# Patient Record
Sex: Male | Born: 1955 | Race: White | Hispanic: No | Marital: Married | Smoking: Never smoker
Health system: Southern US, Community
[De-identification: ages and names within clinical notes are randomized; demographics above are authoritative.]

## PROBLEM LIST (undated history)

## (undated) DIAGNOSIS — M1990 Unspecified osteoarthritis, unspecified site: Secondary | ICD-10-CM

## (undated) DIAGNOSIS — K219 Gastro-esophageal reflux disease without esophagitis: Secondary | ICD-10-CM

## (undated) DIAGNOSIS — F419 Anxiety disorder, unspecified: Secondary | ICD-10-CM

## (undated) DIAGNOSIS — I10 Essential (primary) hypertension: Secondary | ICD-10-CM

## (undated) HISTORY — PX: SEPTOPLASTY: SUR1290

## (undated) HISTORY — PX: TENDON REPAIR: SHX5111

---

## 1977-08-29 HISTORY — PX: APPENDECTOMY: SHX54

## 1984-08-29 HISTORY — PX: VASECTOMY: SHX75

## 1986-08-29 HISTORY — PX: EYE SURGERY: SHX253

## 2007-08-30 HISTORY — PX: BACK SURGERY: SHX140

## 2008-05-15 ENCOUNTER — Ambulatory Visit (HOSPITAL_COMMUNITY): Admission: RE | Admit: 2008-05-15 | Discharge: 2008-05-16 | Payer: Self-pay | Admitting: Neurosurgery

## 2009-03-19 IMAGING — CR DG CHEST 2V
2 series · 2 of 2 positions shown · non-contrast
Comparison: None

CLINICAL DATA: Lumbar disc herniation, hypertension, preoperative
assessment

CHEST - 2 VIEW

[view not recorded (1 of 2)]
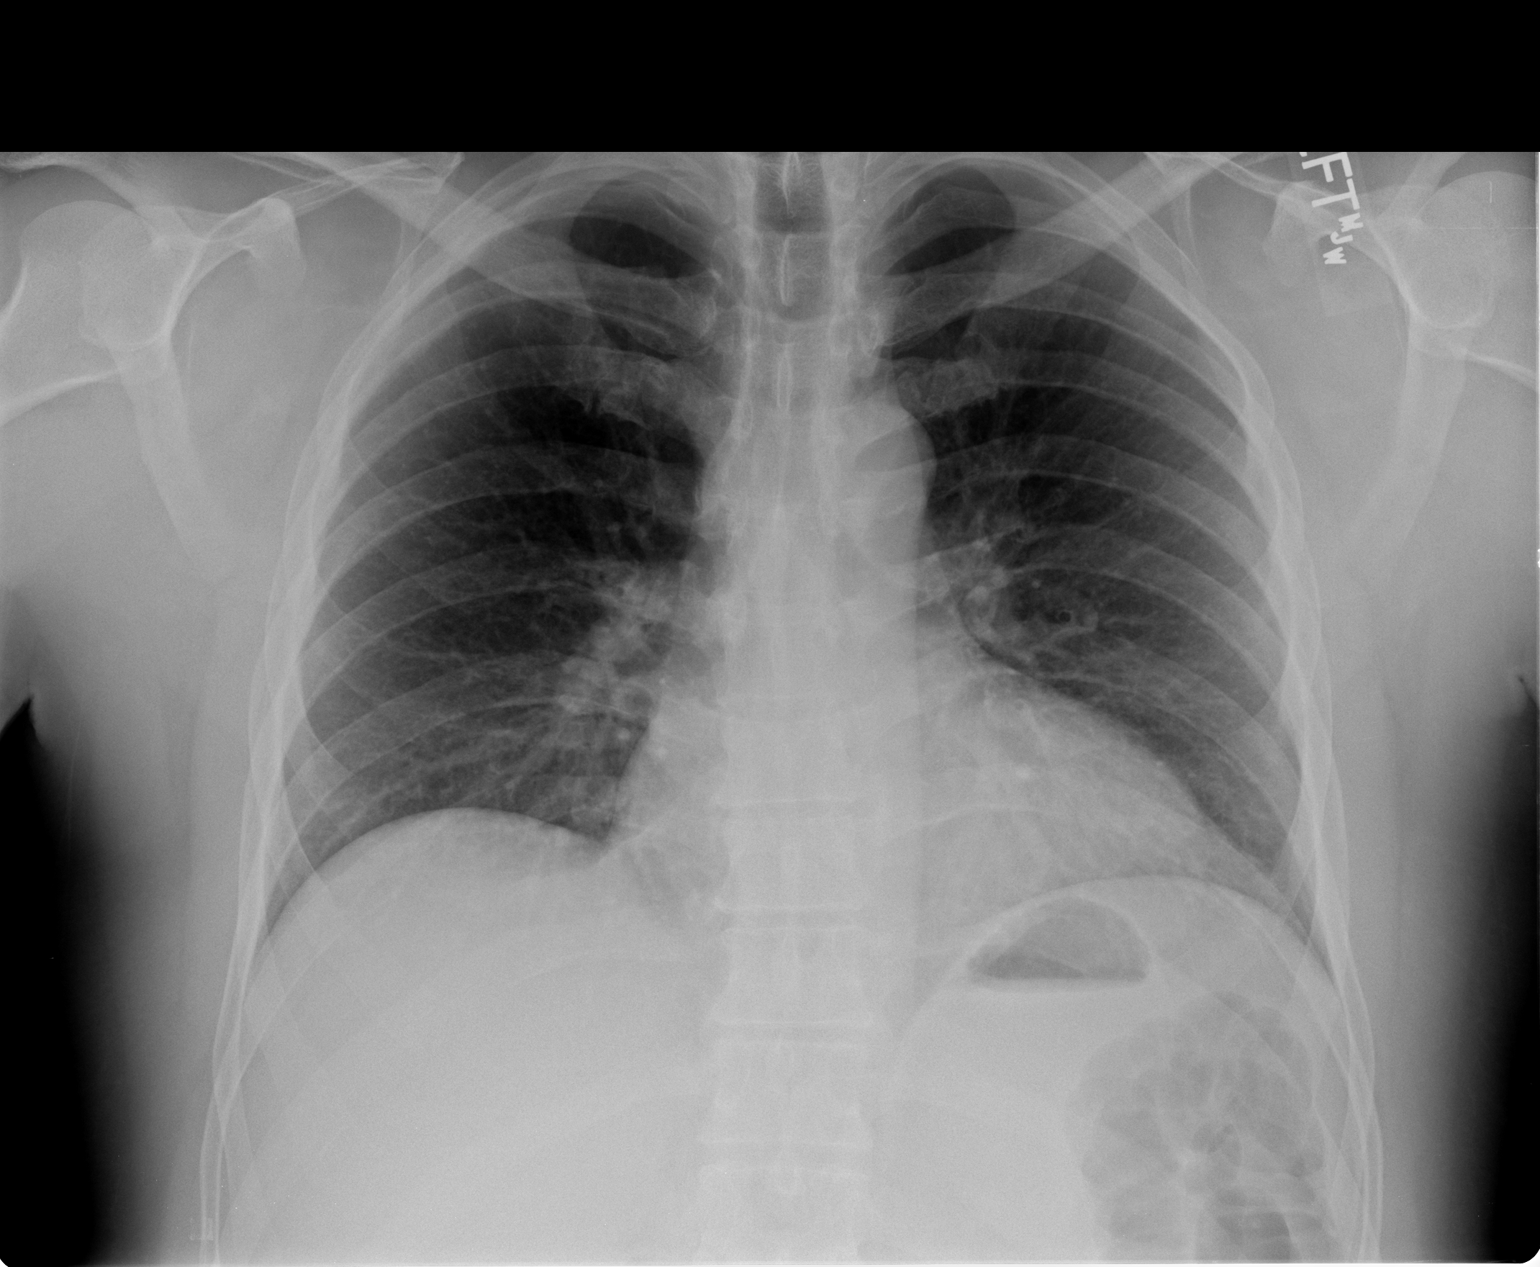

[view not recorded (2 of 2)]
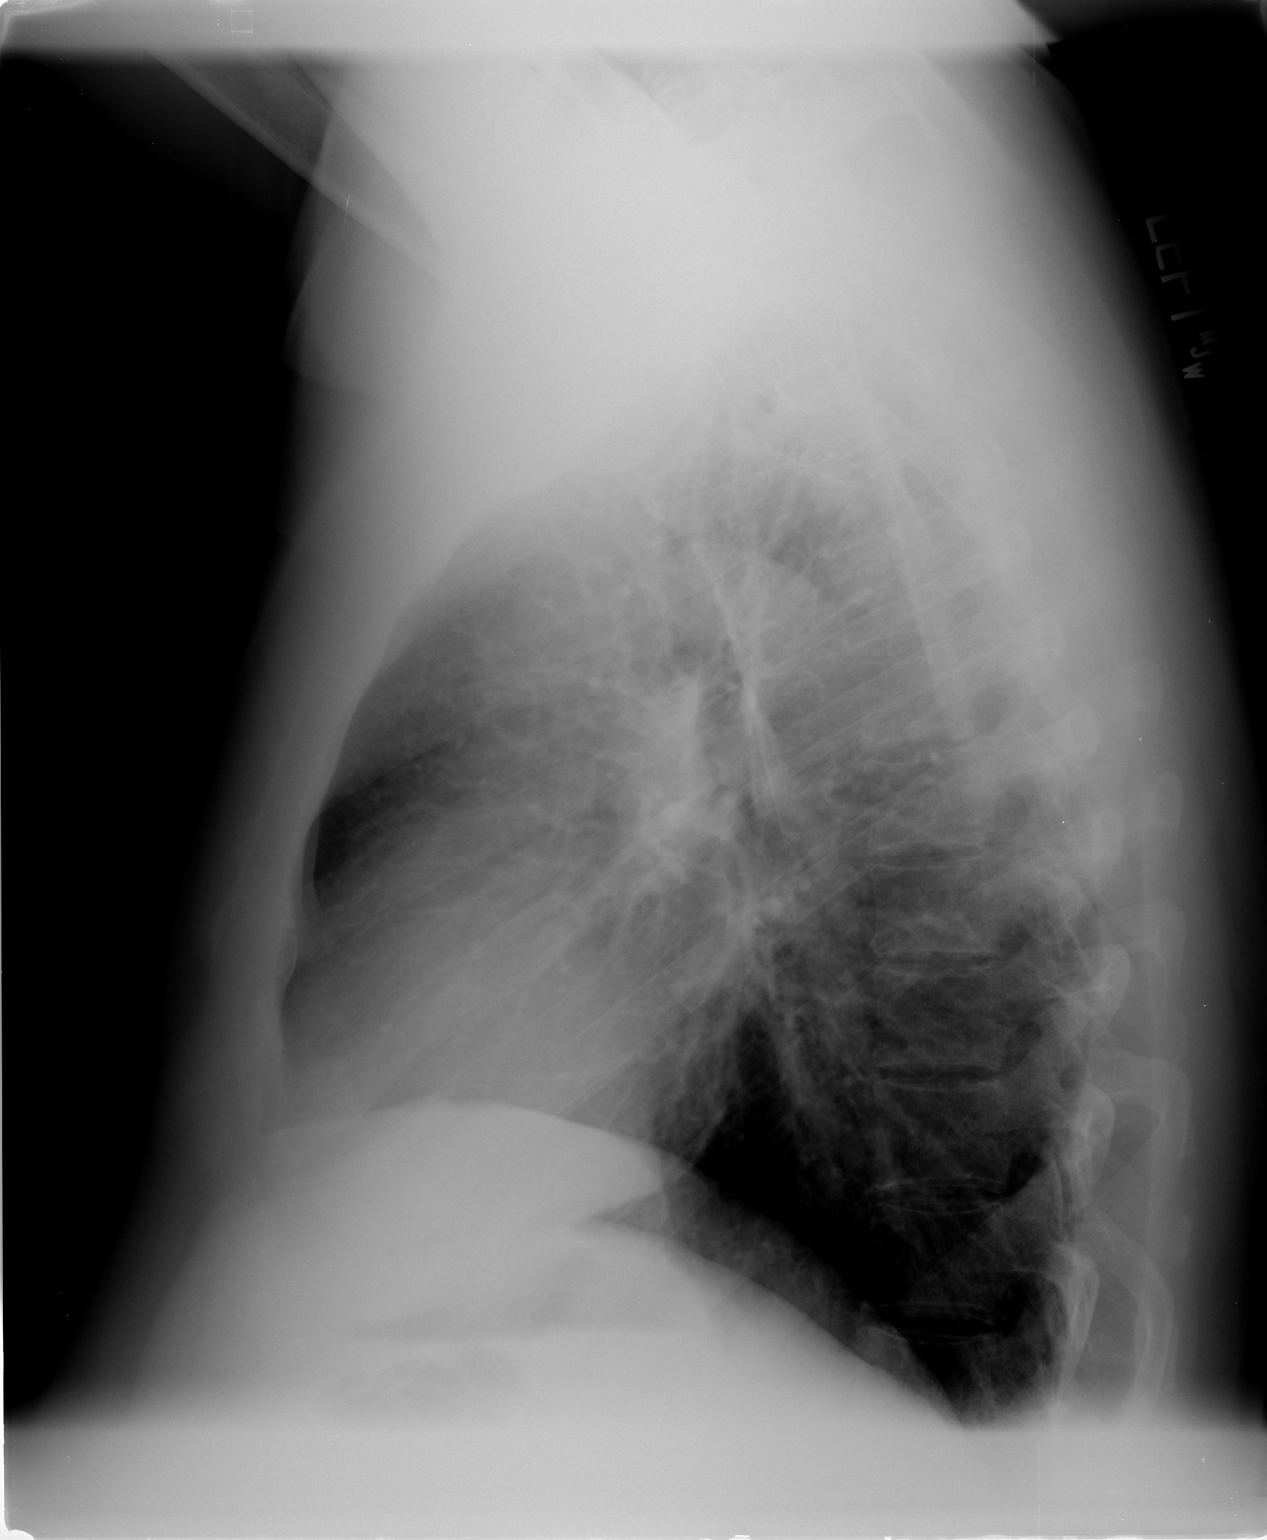

[2 of 2 positions shown; findings below may reference images not displayed]

FINDINGS: Cardiac enlargement.
Normal mediastinal contours and pulmonary vascularity.
Bronchitic changes without infiltrate or effusion.
No acute bony abnormalities.
IMPRESSION: Cardiomegaly with minimal bronchitic changes.

## 2011-01-11 NOTE — Op Note (Signed)
NAMEVITALI, Bryan Ellis            ACCOUNT NO.:  [id number]   MEDICAL RECORD NO.:  [id number]          PATIENT TYPE:  OIB   LOCATION:  3528                         FACILITY:  [facility]   PHYSICIAN:  Cristi Loron, M.D.DATE OF BIRTH:  [birth year]   DATE OF PROCEDURE:  05/15/2008  DATE OF DISCHARGE:                               OPERATIVE REPORT   BRIEF HISTORY:  The patient is a 55 year old white male who suffered  from back and right leg pain.  He failed medical management, was worked  up with lumbar MRI, which demonstrated he had  multiple bulging disks,  but had a ruptured disk behind the right L3 vertebral body.  I discussed  the various treatment options with the patient including the surgery.  He is aware of the risks, benefits, and alternatives of surgery, and  decided to proceed with L3 laminectomy to decompress the L3 and L4 nerve  roots.   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   PROCEDURES:  Right L3 hemilaminectomy to decompress the right L3 and L4  nerve roots using microdissection.   SURGEON:  Cristi Loron, MD   ASSISTANT:  Coletta Memos, MD   ANESTHESIA:  General endotracheal.   ESTIMATED BLOOD LOSS:  50 mL.   SPECIMENS:  None.   DRAINS:  None.   COMPLICATIONS:  None.   DESCRIPTION OF PROCEDURE:  The patient was brought to the operating room  by anesthesia team and general endotracheal anesthesia was induced.  The  patient was turned to prone position on the [place] frame.  His  lumbosacral region was then prepared with Betadine scrub and Betadine  solution.  Sterile drapes were applied.  I then injected the area to be  incised with Marcaine with epinephrine solution.  I used a scalpel to  make a linear midline incision over  the L3-4 interspace.  I used  electrocautery to perform a right-sided subperiosteal dissection  exposing the right spinous process and lamina of L3.  We obtained  intraoperative radiograph to confirm our location.   We then inserted the [facility] retractor for exposure.  We brought the  operative microscope into the field and under its magnification and  illumination, we completed the microdissection/decompression.  I used a  high-speed drill to perform a right L3 laminotomy.  I completed the  right L3 hemilaminectomy with Kerrison punch and removed the right L3-4  and L2-3 ligamentum flavum.  This gave [place] good exposure of the thecal  sac.  We then performed foraminotomies about the right L3 and L4 nerve  roots.  We then used microdissection to free up the thecal sac and nerve  roots from the epidural tissue.  The thecal sac and the nerve roots were  retracted medially with a nerve retractor.  This exposed a relatively  small disk herniation at L3-4 on the right, which was directly ventral  to the exiting L3 nerve root.  We removed in multiple fragments using  pituitary  forceps.  Because this disk herniation was smaller than I  anticipated, we obtained a second intraoperative radiograph that  demonstrated the extent of laminectomy and dural exposure.  We then  palpated along the ventral surface of the thecal sac along the exit  route of the right L3 and L4 nerve roots and noted that the neural  structures were well decompressed.  We obtained hemostasis using bipolar  electrocautery.  We irrigated the wound out with bacitracin solution,  removed the retractors, and then reapproximated the patient's  thoracolumbar fascia with interrupted #1 Vicryl suture, subcutaneous  tissue with interrupted 2-0 Vicryl suture, and the skin with Steri-  Strips and benzoin.  The wound was then coated with bacitracin ointment  and sterile dressing applied.  The drapes were removed.  The patient was   subsequently returned to the supine position where he was extubated by  the anesthesia team and transported to the Post Anesthesia Care Unit in  stable condition.  All sponge, instrument, and needle counts were  correct at this case.       Cristi Loron, M.D.  Electronically Signed     JDJ/MEDQ  D:  05/15/2008  T:  05/16/2008  Job:  [id number]

## 2011-06-01 LAB — NO BLOOD PRODUCTS

## 2011-06-01 LAB — BASIC METABOLIC PANEL
Calcium: 9.8
Creatinine, Ser: 1.03
GFR calc Af Amer: >60
Sodium: 138

## 2011-06-01 LAB — CBC
RBC: 5.01
WBC: 8.6

## 2014-04-01 ENCOUNTER — Ambulatory Visit (INDEPENDENT_AMBULATORY_CARE_PROVIDER_SITE_OTHER): Payer: 59 | Admitting: Podiatrist

## 2014-04-01 ENCOUNTER — Encounter: Payer: Self-pay | Admitting: Podiatrist

## 2014-04-01 ENCOUNTER — Ambulatory Visit (INDEPENDENT_AMBULATORY_CARE_PROVIDER_SITE_OTHER): Payer: 59

## 2014-04-01 VITALS — BP 145/87 | HR 57 | Resp 18

## 2014-04-01 DIAGNOSIS — M722 Plantar fascial fibromatosis: Secondary | ICD-10-CM

## 2014-04-01 MED ORDER — MELOXICAM 15 MG PO TABS: 15.0000 mg | ORAL_TABLET | Freq: Every day | ORAL | Status: AC

## 2014-04-01 MED ORDER — TRIAMCINOLONE ACETONIDE 10 MG/ML IJ SUSP
10.0000 mg | Freq: Once | INTRAMUSCULAR | Status: AC
  Administered 2014-04-01: 10 mg

## 2014-04-01 MED ORDER — MELOXICAM 15 MG PO TABS: 15.0000 mg | ORAL_TABLET | Freq: Every day | ORAL | Status: DC

## 2014-04-01 NOTE — Progress Notes (Signed)
   Subjective:    Patient ID: Bryan Ellis, male    DOB: 07/02/1956, 58 y.o.   MRN: [id number]  HPI I HAVE SOME RIGHT HEEL PAIN AND HAS BEEN GOING ON FOR ABOUT 2 MONTHS AND HURTS WITH WEIGHT AND FEELS LIKE A BRUISE AND SORE AND TENDER AND HURTS IN AM AND AFTER I GET UP    Review of Systems  Musculoskeletal: Positive for gait problem.  All other systems reviewed and are negative.      Objective:   Physical Exam Patient is awake, alert, and oriented x 3.  In no acute distress.  Vascular status is intact with palpable pedal pulses at 2/4 DP and PT bilateral and capillary refill time within normal limits. Neurological sensation is also intact bilaterally via Semmes Weinstein monofilament at 5/5 sites. Light touch, vibratory sensation, Achilles tendon reflex is intact. Dermatological exam reveals skin color, turger and texture as normal. No open lesions present.  Musculature intact with dorsiflexion, plantarflexion, inversion, eversion. High arched foot type with fat pad atrophy is noted. Pain on palpation plantar medial aspect of the right heel is present with palpable tenderness noted. Plantar fasciitis symptomatology elicited.    Assessment & Plan:  Plantar fasciitis right   Plan: An injection consisting of Kenalog and Marcaine mixture was infiltrated into the area of palpable tenderness of the right foot. The patient tolerated this procedure well. Removable plantar fascial bracing was applied and he was discussed the positive long-term benefits of a custom orthotic device due to his foot type and high arched foot. He will call if he would like to consider the device. Stretching exercises icing exercises and shoe gear changes were also discussed. Her prescription for meloxicam was also called in for him to take for 2 weeks and then when necessary

## 2014-04-01 NOTE — Patient Instructions (Signed)

## 2014-04-22 ENCOUNTER — Encounter: Payer: Self-pay | Admitting: Podiatrist

## 2014-04-22 ENCOUNTER — Ambulatory Visit (INDEPENDENT_AMBULATORY_CARE_PROVIDER_SITE_OTHER): Payer: 59 | Admitting: Podiatrist

## 2014-04-22 VITALS — BP 138/80 | HR 57 | Resp 18

## 2014-04-22 DIAGNOSIS — M722 Plantar fascial fibromatosis: Secondary | ICD-10-CM

## 2014-04-25 ENCOUNTER — Telehealth: Payer: Self-pay | Admitting: *Deleted

## 2014-04-25 NOTE — Telephone Encounter (Signed)
He was wanting me to call y'all and see if his insurance had approved the orthopedic inserts.  So if someone could give me a call back.  He's just a little bit anxious because his foot is still hurting him so bad.  Thank you very much.

## 2014-04-25 NOTE — Progress Notes (Signed)
Chief Complaint  Patient presents with  . Plantar Fasciitis    MY RIGHT HEEL WAS DOING GOOD AND THEN IS IS STILL SORE AND NOT AS BAD AS IT WAS BUT I CAN STILL FEEL IT     HPI: Patient is 58 y.o. male who presents today for followup plantar fasciitis right heel  Physical Exam  Patient is awake, alert, and oriented x 3.  In no acute distress.  Vascular status is intact with palpable pedal pulses at 2/4 DP and PT bilateral and capillary refill time within normal limits. Neurological sensation is also intact bilaterally via Semmes Weinstein monofilament at 5/5 sites. Light touch, vibratory sensation, Achilles tendon reflex is intact. Dermatological exam reveals skin color, turger and texture as normal. No open lesions present.  Musculature intact with dorsiflexion, plantarflexion, inversion, eversion. Continued plantar fasciitis symptomatology is elicited right heel   Assessment: Plantar fasciitis right heel  Plan: And then injected the right heel for injection #2. Discussed the positive long-term benefits orthotics, shoe gear changes, stretching, icing and conservative therapies were discussed. He'll be seen back if he continues to be problematic.

## 2014-04-28 NOTE — Telephone Encounter (Signed)
This is for melody. lisa

## 2014-07-29 NOTE — Telephone Encounter (Signed)
This encounter was created in error - please disregard.

## 2017-04-25 ENCOUNTER — Other Ambulatory Visit: Payer: Self-pay | Admitting: Neurosurgery

## 2017-06-12 NOTE — Pre-Procedure Instructions (Signed)
GASPARD ISBELL  06/12/2017      [facility] - [place], [place] [address] [place] [address] Phone: [phone] Fax: [phone]    Your procedure is scheduled on Mon. Oct. 22  Report to [facility] Admitting at 5:30  A.M.  Call this number if you have problems the morning of surgery:  [phone]   Remember:  Do not eat food or drink liquids after midnight on Sun. Oct. 21   Take these medicines the morning of surgery with A SIP OF WATER :amlodipine (norvasc),buspirine (buspar), carvedilol (coreg), pantoprazole (protonix)             7 days prior to surgery STOP taking any Aspirin (unless otherwise instructed by your surgeon), Aleve, Naproxen, Ibuprofen, Motrin, Advil, Goody's, BC's, all herbal medications, fish oil, and all vitamins   Do not wear jewelry.  Do not wear lotions, powders, or perfumes, or deoderant.  Do not shave 48 hours prior to surgery.  Men may shave face and neck.  Do not bring valuables to the hospital.  [facility] is not responsible for any belongings or valuables.  Contacts, dentures or bridgework may not be worn into surgery.  Leave your suitcase in the car.  After surgery it may be brought to your room.  For patients admitted to the hospital, discharge time will be determined by your treatment team.  Patients discharged the day of surgery will not be allowed to drive home.   Special instructions:  [place]- Preparing For Surgery  Before surgery, you can play an important role. Because skin is not sterile, your skin needs to be as free of germs as possible. You can reduce the number of germs on your skin by washing with CHG (chlorahexidine gluconate) Soap before surgery.  CHG is an antiseptic cleaner which kills germs and bonds with the skin to continue killing germs even after washing.  Please do not use if you have an allergy to CHG or antibacterial soaps. If your skin becomes reddened/irritated  stop using the CHG.  Do not shave (including legs and underarms) for at least 48 hours prior to first CHG shower. It is OK to shave your face.  Please follow these instructions carefully.   1. Shower the NIGHT BEFORE SURGERY and the MORNING OF SURGERY with CHG.   2. If you chose to wash your hair, wash your hair first as usual with your normal shampoo.  3. After you shampoo, rinse your hair and body thoroughly to remove the shampoo.  4. Use CHG as you would any other liquid soap. You can apply CHG directly to the skin and wash gently with a scrungie or a clean washcloth.   5. Apply the CHG Soap to your body ONLY FROM THE NECK DOWN.  Do not use on open wounds or open sores. Avoid contact with your eyes, ears, mouth and genitals (private parts). Wash Face and genitals (private parts)  with your normal soap.  6. Wash thoroughly, paying special attention to the area where your surgery will be performed.  7. Thoroughly rinse your body with warm water from the neck down.  8. DO NOT shower/wash with your normal soap after using and rinsing off the CHG Soap.  9. Pat yourself dry with a CLEAN TOWEL.  10. Wear CLEAN PAJAMAS to bed the night before surgery, wear comfortable clothes the morning of surgery  11. Place CLEAN SHEETS on your bed the night of your first  shower and DO NOT SLEEP WITH PETS.    Day of Surgery: Do not apply any deodorants/lotions. Please wear clean clothes to the hospital/surgery center.      Please read over the following fact sheets that you were given. Coughing and Deep Breathing, MRSA Information and Surgical Site Infection Prevention

## 2017-06-13 ENCOUNTER — Encounter (HOSPITAL_COMMUNITY): Payer: Self-pay

## 2017-06-13 ENCOUNTER — Encounter (HOSPITAL_COMMUNITY)
Admission: RE | Admit: 2017-06-13 | Discharge: 2017-06-13 | Disposition: A | Discharge status: Home / Self Care | Payer: 59 | Source: Ambulatory Visit | Attending: Neurosurgery | Admitting: Neurosurgery

## 2017-06-13 DIAGNOSIS — Z01812 Encounter for preprocedural laboratory examination: Secondary | ICD-10-CM | POA: Insufficient documentation

## 2017-06-13 DIAGNOSIS — Z01810 Encounter for preprocedural cardiovascular examination: Secondary | ICD-10-CM | POA: Insufficient documentation

## 2017-06-13 DIAGNOSIS — M5416 Radiculopathy, lumbar region: Secondary | ICD-10-CM | POA: Insufficient documentation

## 2017-06-13 DIAGNOSIS — R001 Bradycardia, unspecified: Secondary | ICD-10-CM | POA: Insufficient documentation

## 2017-06-13 HISTORY — DX: Essential (primary) hypertension: I10

## 2017-06-13 HISTORY — DX: Gastro-esophageal reflux disease without esophagitis: K21.9

## 2017-06-13 HISTORY — DX: Anxiety disorder, unspecified: F41.9

## 2017-06-13 HISTORY — DX: Unspecified osteoarthritis, unspecified site: M19.90

## 2017-06-13 LAB — BASIC METABOLIC PANEL
Anion gap: 8 (ref 5–15)
BUN: 12 mg/dL (ref 6–20)
CHLORIDE: 105 mmol/L (ref 101–111)
CO2: 25 mmol/L (ref 22–32)
Calcium: 9.7 mg/dL (ref 8.9–10.3)
Creatinine, Ser: 0.9 mg/dL (ref 0.61–1.24)
GFR calc non Af Amer: >60 mL/min (ref >60)
Glucose, Bld: 109 mg/dL — ABNORMAL HIGH (ref 65–99)
POTASSIUM: 3.4 mmol/L — AB (ref 3.5–5.1)
SODIUM: 138 mmol/L (ref 135–145)

## 2017-06-13 LAB — CBC
HEMATOCRIT: 44.9 % (ref 39.0–52.0)
Hemoglobin: 15.8 g/dL (ref 13.0–17.0)
MCH: 31.5 pg (ref 26.0–34.0)
MCHC: 35.2 g/dL (ref 30.0–36.0)
MCV: 89.4 fL (ref 78.0–100.0)
Platelets: 208 10*3/uL (ref 150–400)
RBC: 5.02 MIL/uL (ref 4.22–5.81)
RDW: 13.5 % (ref 11.5–15.5)
WBC: 7 10*3/uL (ref 4.0–10.5)

## 2017-06-13 LAB — NO BLOOD PRODUCTS

## 2017-06-13 LAB — SURGICAL PCR SCREEN
MRSA, PCR: NEGATIVE
STAPHYLOCOCCUS AUREUS: NEGATIVE

## 2017-06-13 NOTE — Progress Notes (Signed)
PCP: Dr. Kennith Maes @ [facility] in [place], [place]  Cardiologist: Dr. Esmeralda Links only once  Pt. Report he has a flushing sensation once every 6 weeks that last 24 hours. PCP is aware. States this has been going on for 4 yrs. And they don't know what is causing it. Pt. Reports Dr. Helene Kelp thinks it's stress related. It starts when he is sleeping, and starts at the chest and goes to his head. Denis chest pain or palpitations

## 2017-06-14 NOTE — Progress Notes (Signed)
Anesthesia Chart Review:  Pt refuses all blood products.   Pt is a 61 year old male scheduled for L2-3, L3-4, L4-5 microdiscectomy, laminectomy on 06/19/2017 with Newman Pies, MD  - PCP is Kennith Maes, MD - Pt saw cardiologist Shirlee More, MD 06/23/15 for "flushing" sensation pt experiences every 6 weeks or so for 24 hours for last several years. Felt to be non-cardiac, no work up pursued.   PMH includes:  HTN, GERD. Never smoker. BMI 27  Medications include: Amlodipine, ASA 81 mg, carvedilol, Protonix, valsartan-HCTZ  BP (!) 159/81   Pulse (!) 58   Temp 36.8 C   Resp 20   Ht 5\' 11"  (1.803 m)   Wt 191 lb 3.2 oz (86.7 kg)   SpO2 99%   BMI 26.67 kg/m   Preoperative labs reviewed.    EKG 06/13/17: Sinus bradycardia (57 bpm)  If no changes, I anticipate pt can proceed with surgery as scheduled.   Willeen Cass, FNP-BC [facility] Short Stay Surgical Center/Anesthesiology Phone: [phone] 06/14/2017 4:19 PM

## 2017-06-18 NOTE — Anesthesia Preprocedure Evaluation (Addendum)
Anesthesia Evaluation  Patient identified by MRN, date of birth, ID band Patient awake    Reviewed: Allergy & Precautions, NPO status , Patient's Chart, lab work & pertinent test results, reviewed documented beta blocker date and time   Airway Mallampati: III  TM Distance: >3 FB Neck ROM: Full    Dental no notable dental hx. (+) Teeth Intact, Dental Advisory Given   Pulmonary neg pulmonary ROS,    Pulmonary exam normal breath sounds clear to auscultation       Cardiovascular hypertension, Pt. on medications and Pt. on home beta blockers negative cardio ROS Normal cardiovascular exam Rhythm:Regular Rate:Normal     Neuro/Psych negative neurological ROS  negative psych ROS   GI/Hepatic negative GI ROS, Neg liver ROS, GERD  Medicated and Controlled,  Endo/Other  negative endocrine ROS  Renal/GU negative Renal ROS  negative genitourinary   Musculoskeletal negative musculoskeletal ROS (+)   Abdominal   Peds negative pediatric ROS (+)  Hematology negative hematology ROS (+)   Anesthesia Other Findings   Reproductive/Obstetrics negative OB ROS                            Anesthesia Physical Anesthesia Plan  ASA: II  Anesthesia Plan: General   Post-op Pain Management:    Induction: Intravenous  PONV Risk Score and Plan: 2 and Ondansetron, Dexamethasone, Treatment may vary due to age or medical condition and Midazolam  Airway Management Planned: Oral ETT  Additional Equipment:   Intra-op Plan:   Post-operative Plan: Extubation in OR  Informed Consent:   Plan Discussed with:   Anesthesia Plan Comments: (  )        Anesthesia Quick Evaluation

## 2017-06-19 ENCOUNTER — Encounter (HOSPITAL_COMMUNITY): Payer: Self-pay | Admitting: General Practice

## 2017-06-19 ENCOUNTER — Ambulatory Visit (HOSPITAL_COMMUNITY): Payer: 59 | Admitting: Vascular Surgery

## 2017-06-19 ENCOUNTER — Ambulatory Visit (HOSPITAL_COMMUNITY): Payer: 59 | Admitting: Anesthesiology

## 2017-06-19 ENCOUNTER — Ambulatory Visit (HOSPITAL_COMMUNITY)
Admission: RE | Admit: 2017-06-19 | Discharge: 2017-06-19 | Disposition: A | Discharge status: Home / Self Care | Payer: 59 | Source: Ambulatory Visit | Attending: Neurosurgery | Admitting: Neurosurgery

## 2017-06-19 ENCOUNTER — Encounter (HOSPITAL_COMMUNITY)
Admission: RE | Disposition: A | Discharge status: Home / Self Care | Payer: Self-pay | Source: Ambulatory Visit | Attending: Neurosurgery

## 2017-06-19 DIAGNOSIS — I10 Essential (primary) hypertension: Secondary | ICD-10-CM | POA: Insufficient documentation

## 2017-06-19 DIAGNOSIS — F419 Anxiety disorder, unspecified: Secondary | ICD-10-CM | POA: Diagnosis not present

## 2017-06-19 DIAGNOSIS — Z419 Encounter for procedure for purposes other than remedying health state, unspecified: Secondary | ICD-10-CM

## 2017-06-19 DIAGNOSIS — K219 Gastro-esophageal reflux disease without esophagitis: Secondary | ICD-10-CM | POA: Insufficient documentation

## 2017-06-19 DIAGNOSIS — Z79899 Other long term (current) drug therapy: Secondary | ICD-10-CM | POA: Insufficient documentation

## 2017-06-19 DIAGNOSIS — M48061 Spinal stenosis, lumbar region without neurogenic claudication: Secondary | ICD-10-CM | POA: Insufficient documentation

## 2017-06-19 DIAGNOSIS — M5116 Intervertebral disc disorders with radiculopathy, lumbar region: Secondary | ICD-10-CM | POA: Insufficient documentation

## 2017-06-19 DIAGNOSIS — Z7982 Long term (current) use of aspirin: Secondary | ICD-10-CM | POA: Diagnosis not present

## 2017-06-19 DIAGNOSIS — Z791 Long term (current) use of non-steroidal anti-inflammatories (NSAID): Secondary | ICD-10-CM | POA: Insufficient documentation

## 2017-06-19 DIAGNOSIS — M5416 Radiculopathy, lumbar region: Secondary | ICD-10-CM

## 2017-06-19 HISTORY — PX: LUMBAR LAMINECTOMY/DECOMPRESSION MICRODISCECTOMY: SHX5026

## 2017-06-19 SURGERY — LUMBAR LAMINECTOMY/DECOMPRESSION MICRODISCECTOMY 2 LEVELS
Anesthesia: General | Site: Back | Laterality: Left

## 2017-06-19 MED ORDER — ROCURONIUM BROMIDE 100 MG/10ML IV SOLN
INTRAVENOUS | Status: DC | PRN
  Administered 2017-06-19: 50 mg via INTRAVENOUS

## 2017-06-19 MED ORDER — PROPOFOL 10 MG/ML IV BOLUS
INTRAVENOUS | Status: AC
  Filled 2017-06-19: qty 20

## 2017-06-19 MED ORDER — 0.9 % SODIUM CHLORIDE (POUR BTL) OPTIME
TOPICAL | Status: DC | PRN
  Administered 2017-06-19: 1000 mL

## 2017-06-19 MED ORDER — DEXAMETHASONE SODIUM PHOSPHATE 10 MG/ML IJ SOLN
INTRAMUSCULAR | Status: DC | PRN
  Administered 2017-06-19: 10 mg via INTRAVENOUS

## 2017-06-19 MED ORDER — BUPIVACAINE-EPINEPHRINE (PF) 0.5% -1:200000 IJ SOLN
INTRAMUSCULAR | Status: AC
  Filled 2017-06-19: qty 30

## 2017-06-19 MED ORDER — HYDROCHLOROTHIAZIDE 25 MG PO TABS
25.0000 mg | ORAL_TABLET | Freq: Every day | ORAL | Status: DC
  Administered 2017-06-19: 25 mg via ORAL
  Filled 2017-06-19: qty 1

## 2017-06-19 MED ORDER — ONDANSETRON HCL 4 MG/2ML IJ SOLN
INTRAMUSCULAR | Status: DC | PRN
  Administered 2017-06-19: 4 mg via INTRAVENOUS

## 2017-06-19 MED ORDER — FENTANYL CITRATE (PF) 100 MCG/2ML IJ SOLN: 25.0000 ug | INTRAMUSCULAR | Status: DC | PRN

## 2017-06-19 MED ORDER — EPHEDRINE 5 MG/ML INJ
INTRAVENOUS | Status: AC
  Filled 2017-06-19: qty 10

## 2017-06-19 MED ORDER — MENTHOL 3 MG MT LOZG: 1.0000 | LOZENGE | OROMUCOSAL | Status: DC | PRN

## 2017-06-19 MED ORDER — CARVEDILOL 6.25 MG PO TABS
18.7500 mg | ORAL_TABLET | Freq: Two times a day (BID) | ORAL | Status: DC
  Filled 2017-06-19: qty 3

## 2017-06-19 MED ORDER — CEFAZOLIN SODIUM-DEXTROSE 2-4 GM/100ML-% IV SOLN
2.0000 g | INTRAVENOUS | Status: AC
  Administered 2017-06-19: 2 g via INTRAVENOUS

## 2017-06-19 MED ORDER — DEXAMETHASONE SODIUM PHOSPHATE 10 MG/ML IJ SOLN
INTRAMUSCULAR | Status: AC
  Filled 2017-06-19: qty 1

## 2017-06-19 MED ORDER — SUGAMMADEX SODIUM 200 MG/2ML IV SOLN
INTRAVENOUS | Status: AC
  Filled 2017-06-19: qty 2

## 2017-06-19 MED ORDER — IRBESARTAN 300 MG PO TABS
300.0000 mg | ORAL_TABLET | Freq: Every day | ORAL | Status: DC
  Filled 2017-06-19: qty 1

## 2017-06-19 MED ORDER — SODIUM CHLORIDE 0.9% FLUSH: 3.0000 mL | Freq: Two times a day (BID) | INTRAVENOUS | Status: DC

## 2017-06-19 MED ORDER — SODIUM CHLORIDE 0.9 % IV SOLN: 250.0000 mL | INTRAVENOUS | Status: DC

## 2017-06-19 MED ORDER — BISACODYL 10 MG RE SUPP
10.0000 mg | Freq: Every day | RECTAL | Status: DC | PRN
Start: 2017-06-19 — End: 2017-06-19

## 2017-06-19 MED ORDER — CEFAZOLIN SODIUM-DEXTROSE 2-4 GM/100ML-% IV SOLN
INTRAVENOUS | Status: AC
  Filled 2017-06-19: qty 100

## 2017-06-19 MED ORDER — MIDAZOLAM HCL 5 MG/5ML IJ SOLN
INTRAMUSCULAR | Status: DC | PRN
  Administered 2017-06-19: 2 mg via INTRAVENOUS

## 2017-06-19 MED ORDER — VALSARTAN-HYDROCHLOROTHIAZIDE 320-25 MG PO TABS: 1.0000 | ORAL_TABLET | Freq: Every day | ORAL | Status: DC

## 2017-06-19 MED ORDER — BUSPIRONE HCL 10 MG PO TABS
10.0000 mg | ORAL_TABLET | Freq: Two times a day (BID) | ORAL | Status: DC
Start: 2017-06-19 — End: 2017-06-19
  Filled 2017-06-19 (×2): qty 1

## 2017-06-19 MED ORDER — THROMBIN (RECOMBINANT) 5000 UNITS EX SOLR
CUTANEOUS | Status: DC | PRN
  Administered 2017-06-19 (×2): 5000 [IU] via TOPICAL

## 2017-06-19 MED ORDER — ONDANSETRON HCL 4 MG PO TABS: 4.0000 mg | ORAL_TABLET | Freq: Four times a day (QID) | ORAL | Status: DC | PRN

## 2017-06-19 MED ORDER — MIDAZOLAM HCL 2 MG/2ML IJ SOLN
INTRAMUSCULAR | Status: AC
  Filled 2017-06-19: qty 2

## 2017-06-19 MED ORDER — HEMOSTATIC AGENTS (NO CHARGE) OPTIME
TOPICAL | Status: DC | PRN
  Administered 2017-06-19: 1 via TOPICAL

## 2017-06-19 MED ORDER — FENTANYL CITRATE (PF) 250 MCG/5ML IJ SOLN
INTRAMUSCULAR | Status: AC
  Filled 2017-06-19: qty 5

## 2017-06-19 MED ORDER — BACITRACIN ZINC 500 UNIT/GM EX OINT
TOPICAL_OINTMENT | CUTANEOUS | Status: DC | PRN
  Administered 2017-06-19: 1 via TOPICAL

## 2017-06-19 MED ORDER — MEPERIDINE HCL 25 MG/ML IJ SOLN: 6.2500 mg | INTRAMUSCULAR | Status: DC | PRN

## 2017-06-19 MED ORDER — SUGAMMADEX SODIUM 200 MG/2ML IV SOLN
INTRAVENOUS | Status: DC | PRN
  Administered 2017-06-19: 200 mg via INTRAVENOUS

## 2017-06-19 MED ORDER — EPHEDRINE SULFATE 50 MG/ML IJ SOLN
INTRAMUSCULAR | Status: DC | PRN
  Administered 2017-06-19 (×2): 5 mg via INTRAVENOUS

## 2017-06-19 MED ORDER — OXYCODONE HCL 5 MG PO TABS: 5.0000 mg | ORAL_TABLET | ORAL | Status: DC | PRN

## 2017-06-19 MED ORDER — ONDANSETRON HCL 4 MG/2ML IJ SOLN
INTRAMUSCULAR | Status: AC
  Filled 2017-06-19: qty 2

## 2017-06-19 MED ORDER — ONDANSETRON HCL 4 MG/2ML IJ SOLN: 4.0000 mg | Freq: Once | INTRAMUSCULAR | Status: DC | PRN

## 2017-06-19 MED ORDER — ACETAMINOPHEN 325 MG PO TABS: 650.0000 mg | ORAL_TABLET | ORAL | Status: DC | PRN

## 2017-06-19 MED ORDER — ONDANSETRON HCL 4 MG/2ML IJ SOLN: 4.0000 mg | Freq: Four times a day (QID) | INTRAMUSCULAR | Status: DC | PRN

## 2017-06-19 MED ORDER — MELOXICAM 7.5 MG PO TABS
15.0000 mg | ORAL_TABLET | Freq: Every day | ORAL | Status: DC
  Filled 2017-06-19: qty 2

## 2017-06-19 MED ORDER — SODIUM CHLORIDE 0.9% FLUSH: 3.0000 mL | INTRAVENOUS | Status: DC | PRN

## 2017-06-19 MED ORDER — LACTATED RINGERS IV SOLN
INTRAVENOUS | Status: DC | PRN
  Administered 2017-06-19 (×2): via INTRAVENOUS

## 2017-06-19 MED ORDER — CHLORHEXIDINE GLUCONATE CLOTH 2 % EX PADS: 6.0000 | MEDICATED_PAD | Freq: Once | CUTANEOUS | Status: DC

## 2017-06-19 MED ORDER — PANTOPRAZOLE SODIUM 40 MG PO TBEC: 40.0000 mg | DELAYED_RELEASE_TABLET | Freq: Every day | ORAL | Status: DC

## 2017-06-19 MED ORDER — THROMBIN (RECOMBINANT) 5000 UNITS EX SOLR
OROMUCOSAL | Status: DC | PRN
  Administered 2017-06-19: 09:00:00 via TOPICAL

## 2017-06-19 MED ORDER — AMLODIPINE BESYLATE 2.5 MG PO TABS
2.5000 mg | ORAL_TABLET | Freq: Every day | ORAL | Status: DC
  Filled 2017-06-19: qty 1

## 2017-06-19 MED ORDER — OXYCODONE HCL 5 MG PO TABS: 5.0000 mg | ORAL_TABLET | ORAL | 0 refills | Status: AC | PRN

## 2017-06-19 MED ORDER — LIDOCAINE HCL (CARDIAC) 20 MG/ML IV SOLN
INTRAVENOUS | Status: DC | PRN
  Administered 2017-06-19: 100 mg via INTRAVENOUS

## 2017-06-19 MED ORDER — CEFAZOLIN SODIUM-DEXTROSE 2-4 GM/100ML-% IV SOLN
2.0000 g | Freq: Three times a day (TID) | INTRAVENOUS | Status: DC
  Administered 2017-06-19: 2 g via INTRAVENOUS
  Filled 2017-06-19: qty 100

## 2017-06-19 MED ORDER — ACETAMINOPHEN 650 MG RE SUPP: 650.0000 mg | RECTAL | Status: DC | PRN

## 2017-06-19 MED ORDER — MORPHINE SULFATE (PF) 4 MG/ML IV SOLN: 4.0000 mg | INTRAVENOUS | Status: DC | PRN

## 2017-06-19 MED ORDER — DOCUSATE SODIUM 100 MG PO CAPS
100.0000 mg | ORAL_CAPSULE | Freq: Two times a day (BID) | ORAL | Status: DC
  Administered 2017-06-19: 100 mg via ORAL
  Filled 2017-06-19: qty 1

## 2017-06-19 MED ORDER — DOCUSATE SODIUM 100 MG PO CAPS: 100.0000 mg | ORAL_CAPSULE | Freq: Two times a day (BID) | ORAL | 0 refills | Status: AC

## 2017-06-19 MED ORDER — SODIUM CHLORIDE 0.9 % IR SOLN
Status: DC | PRN
  Administered 2017-06-19: 09:00:00

## 2017-06-19 MED ORDER — FENTANYL CITRATE (PF) 100 MCG/2ML IJ SOLN
INTRAMUSCULAR | Status: DC | PRN
  Administered 2017-06-19: 50 ug via INTRAVENOUS
  Administered 2017-06-19: 100 ug via INTRAVENOUS

## 2017-06-19 MED ORDER — BACITRACIN ZINC 500 UNIT/GM EX OINT
TOPICAL_OINTMENT | CUTANEOUS | Status: AC
  Filled 2017-06-19: qty 28.35

## 2017-06-19 MED ORDER — CYCLOBENZAPRINE HCL 10 MG PO TABS: 10.0000 mg | ORAL_TABLET | Freq: Three times a day (TID) | ORAL | 1 refills | Status: AC | PRN

## 2017-06-19 MED ORDER — PROPOFOL 10 MG/ML IV BOLUS
INTRAVENOUS | Status: DC | PRN
  Administered 2017-06-19: 170 mg via INTRAVENOUS

## 2017-06-19 MED ORDER — BUPIVACAINE-EPINEPHRINE (PF) 0.5% -1:200000 IJ SOLN
INTRAMUSCULAR | Status: DC | PRN
  Administered 2017-06-19 (×2): 10 mL

## 2017-06-19 MED ORDER — LIDOCAINE 2% (20 MG/ML) 5 ML SYRINGE
INTRAMUSCULAR | Status: AC
  Filled 2017-06-19: qty 5

## 2017-06-19 MED ORDER — PHENOL 1.4 % MT LIQD: 1.0000 | OROMUCOSAL | Status: DC | PRN

## 2017-06-19 MED ORDER — CYCLOBENZAPRINE HCL 10 MG PO TABS: 10.0000 mg | ORAL_TABLET | Freq: Three times a day (TID) | ORAL | Status: DC | PRN

## 2017-06-19 MED ORDER — OXYCODONE HCL 5 MG PO TABS: 10.0000 mg | ORAL_TABLET | ORAL | Status: DC | PRN

## 2017-06-19 MED ORDER — ROCURONIUM BROMIDE 10 MG/ML (PF) SYRINGE
PREFILLED_SYRINGE | INTRAVENOUS | Status: AC
  Filled 2017-06-19: qty 5

## 2017-06-19 SURGICAL SUPPLY — 55 items
BAG DECANTER FOR FLEXI CONT (MISCELLANEOUS) ×2 IMPLANT
BENZOIN TINCTURE PRP APPL 2/3 (GAUZE/BANDAGES/DRESSINGS) ×2 IMPLANT
BLADE CLIPPER SURG (BLADE) ×2 IMPLANT
BUR MATCHSTICK NEURO 3.0 LAGG (BURR) ×2 IMPLANT
BUR PRECISION FLUTE 6.0 (BURR) ×2 IMPLANT
CANISTER SUCT 3000ML PPV (MISCELLANEOUS) ×2 IMPLANT
CARTRIDGE OIL MAESTRO DRILL (MISCELLANEOUS) ×1 IMPLANT
DIFFUSER DRILL AIR PNEUMATIC (MISCELLANEOUS) ×2 IMPLANT
DRAPE LAPAROTOMY 100X72X124 (DRAPES) ×2 IMPLANT
DRAPE MICROSCOPE LEICA (MISCELLANEOUS) ×2 IMPLANT
DRAPE POUCH INSTRU U-SHP 10X18 (DRAPES) ×2 IMPLANT
DRAPE SURG 17X23 STRL (DRAPES) ×8 IMPLANT
ELECT BLADE 4.0 EZ CLEAN MEGAD (MISCELLANEOUS) ×2
ELECT REM PT RETURN 9FT ADLT (ELECTROSURGICAL) ×2
ELECTRODE BLDE 4.0 EZ CLN MEGD (MISCELLANEOUS) ×1 IMPLANT
ELECTRODE REM PT RTRN 9FT ADLT (ELECTROSURGICAL) ×1 IMPLANT
GAUZE SPONGE 4X4 12PLY STRL (GAUZE/BANDAGES/DRESSINGS) ×2 IMPLANT
GAUZE SPONGE 4X4 16PLY XRAY LF (GAUZE/BANDAGES/DRESSINGS) IMPLANT
GLOVE BIO SURGEON STRL SZ8 (GLOVE) ×2 IMPLANT
GLOVE BIO SURGEON STRL SZ8.5 (GLOVE) ×2 IMPLANT
GLOVE BIOGEL PI IND STRL 6.5 (GLOVE) ×1 IMPLANT
GLOVE BIOGEL PI IND STRL 7.5 (GLOVE) ×1 IMPLANT
GLOVE BIOGEL PI IND STRL 8.5 (GLOVE) ×1 IMPLANT
GLOVE BIOGEL PI INDICATOR 6.5 (GLOVE) ×1
GLOVE BIOGEL PI INDICATOR 7.5 (GLOVE) ×1
GLOVE BIOGEL PI INDICATOR 8.5 (GLOVE) ×1
GLOVE ECLIPSE 9.0 STRL (GLOVE) ×2 IMPLANT
GLOVE EXAM NITRILE LRG STRL (GLOVE) IMPLANT
GLOVE EXAM NITRILE XL STR (GLOVE) IMPLANT
GLOVE EXAM NITRILE XS STR PU (GLOVE) IMPLANT
GLOVE SURG SS PI 6.5 STRL IVOR (GLOVE) ×6 IMPLANT
GOWN STRL REUS W/ TWL LRG LVL3 (GOWN DISPOSABLE) ×1 IMPLANT
GOWN STRL REUS W/ TWL XL LVL3 (GOWN DISPOSABLE) ×2 IMPLANT
GOWN STRL REUS W/TWL 2XL LVL3 (GOWN DISPOSABLE) IMPLANT
GOWN STRL REUS W/TWL LRG LVL3 (GOWN DISPOSABLE) ×1
GOWN STRL REUS W/TWL XL LVL3 (GOWN DISPOSABLE) ×2
KIT BASIN OR (CUSTOM PROCEDURE TRAY) ×2 IMPLANT
KIT ROOM TURNOVER OR (KITS) ×2 IMPLANT
NEEDLE HYPO 21X1.5 SAFETY (NEEDLE) IMPLANT
NEEDLE HYPO 22GX1.5 SAFETY (NEEDLE) ×2 IMPLANT
NS IRRIG 1000ML POUR BTL (IV SOLUTION) ×2 IMPLANT
OIL CARTRIDGE MAESTRO DRILL (MISCELLANEOUS) ×2
PACK LAMINECTOMY NEURO (CUSTOM PROCEDURE TRAY) ×2 IMPLANT
PAD ARMBOARD 7.5X6 YLW CONV (MISCELLANEOUS) ×6 IMPLANT
PATTIES SURGICAL .5 X1 (DISPOSABLE) IMPLANT
RUBBERBAND STERILE (MISCELLANEOUS) ×4 IMPLANT
SPONGE SURGIFOAM ABS GEL SZ50 (HEMOSTASIS) ×2 IMPLANT
STRIP CLOSURE SKIN 1/2X4 (GAUZE/BANDAGES/DRESSINGS) ×2 IMPLANT
SUT VIC AB 1 CT1 18XBRD ANBCTR (SUTURE) ×2 IMPLANT
SUT VIC AB 1 CT1 8-18 (SUTURE) ×2
SUT VIC AB 2-0 CP2 18 (SUTURE) ×4 IMPLANT
TAPE CLOTH SURG 4X10 WHT LF (GAUZE/BANDAGES/DRESSINGS) ×2 IMPLANT
TOWEL GREEN STERILE (TOWEL DISPOSABLE) ×2 IMPLANT
TOWEL GREEN STERILE FF (TOWEL DISPOSABLE) ×2 IMPLANT
WATER STERILE IRR 1000ML POUR (IV SOLUTION) ×2 IMPLANT

## 2017-06-19 NOTE — Transfer of Care (Signed)
Immediate Anesthesia Transfer of Care Note  Patient: Bryan Ellis  Procedure(s) Performed: LEFT MICRODISCECTOMY LUMBAR TWO- LUMBAR THREE, LUMBAR FOUR- LUMBAR FIVE LAMINECTOMY, LEFT (Left Back)  Patient Location: PACU  Anesthesia Type:General  Level of Consciousness: oriented, drowsy and patient cooperative  Airway & Oxygen Therapy: Patient Spontanous Breathing and Patient connected to face mask oxygen  Post-op Assessment: Report given to RN and Post -op Vital signs reviewed and stable  Post vital signs: Reviewed  Last Vitals:  Vitals:   06/19/17 0549  BP: 134/67  Pulse: 60  Resp: 20  Temp: 36.6 C  SpO2: 98%    Last Pain:  Vitals:   06/19/17 0643  TempSrc:   PainSc: 3       Patients Stated Pain Goal: 2 (27/06/23 7628)  Complications: No apparent anesthesia complications

## 2017-06-19 NOTE — H&P (Signed)
Subjective: The patient is a 61 year old white male who has complained of back and left greater than right leg pain. He has failed medica and was worked up with a lumbar M which demonstrated spinal stenosis at L4-5 and a left herniated disc at L2-3. I discussed the various treatment options with the patient. He has decided to proceed with surgery.  Past Medical History:  Diagnosis Date  . Anxiety   . Arthritis   . GERD (gastroesophageal reflux disease)   . Hypertension     Past Surgical History:  Procedure Laterality Date  . APPENDECTOMY  1979  . BACK SURGERY  2009   Dr. Arnoldo Morale  . EYE SURGERY Left 1988   laser surgery  . SEPTOPLASTY  1990's  . TENDON REPAIR Left 2000's  . VASECTOMY  1986    Allergies  Allergen Reactions  . Blood-Group Specific Substance Other (See Comments)    No blood products due to religion   . Niacin And Related Other (See Comments)    unsure  . Other Other (See Comments)    Quinaretic - racing heart   . Prinivil [Lisinopril] Other (See Comments)    Unsure     Social History  Substance Use Topics  . Smoking status: Never Smoker  . Smokeless tobacco: Never Used  . Alcohol use 1.2 oz/week    2 Cans of beer per week     Comment: 2 beers a day    History reviewed. No pertinent family history. Prior to Admission medications   Medication Sig Start Date End Date Taking? Authorizing Provider  amLODipine (NORVASC) 2.5 MG tablet Take 2.5 mg by mouth at bedtime.   Yes [provider]  aspirin EC 81 MG tablet Take 81 mg by mouth daily.   Yes [provider]  busPIRone (BUSPAR) 10 MG tablet Take 10 mg by mouth 2 (two) times daily as needed. For anxiety 04/25/17  Yes [provider]  carvedilol (COREG) 12.5 MG tablet Take 18.75 mg by mouth 2 (two) times daily.  03/07/14  Yes [provider]  ibuprofen (ADVIL,MOTRIN) 200 MG tablet Take 200 mg by mouth every 8 (eight) hours as needed (for back pain.).   Yes [provider]  KRILL OIL PO Take 1 capsule by mouth daily.   Yes [provider]  meloxicam (MOBIC) 15 MG tablet Take 1 tablet (15 mg total) by mouth daily. 04/01/14  Yes Bronson Ing, DPM  Multiple Vitamin (MULTIVITAMIN WITH MINERALS) TABS tablet Take 1 tablet by mouth daily. Centrum   Yes [provider]  Multiple Vitamins-Minerals (PRESERVISION AREDS 2) CHEW Chew 1 tablet by mouth 2 (two) times daily.   Yes [provider]  pantoprazole (PROTONIX) 40 MG tablet Take 40 mg by mouth daily before breakfast. 30 minutes before 05/30/17  Yes [provider]  trolamine salicylate (ASPERCREME) 10 % cream Apply 1 application topically 3 (three) times daily as needed (for sore muscles).   Yes [provider]  valsartan-hydrochlorothiazide (DIOVAN-HCT) 320-25 MG per tablet Take 1 tablet by mouth daily.  03/13/14  Yes [provider]     Review of Systems  Positive ROS: as above  All other systems have been reviewed and were otherwise negative with the exception of those mentioned in the HPI and as above.  Objective: Vital signs in last 24 hours: Temp:  [97.9 F (36.6 C)] 97.9 F (36.6 C) (10/22 0549) Pulse Rate:  [60] 60 (10/22 0549) Resp:  [20] 20 (10/22 0549) BP: (  134)/(67) 134/67 (10/22 0549) SpO2:  [98 %] 98 % (10/22 0549) Weight:  [86.7 kg (191 lb 3.2 oz)] 86.7 kg (191 lb 3.2 oz) (10/22 0643)  General Appearance: Alert Head: Normocephalic, without obvious abnormality, atraumatic Eyes: PERRL, conjunctiva/corneas clear, EOM's intact,    Ears: Normal  Throat: Normal  Neck: Supple, Back: unremarkable Lungs: Clear to auscultation bilaterally, respirations unlabored Heart: Regular rate and rhythm, no murmur, rub or gallop Abdomen: Soft, non-tender Extremities: Extremities normal, atraumatic, no cyanosis or edema Skin: unremarkable  NEUROLOGIC:   Mental status: alert and oriented,Motor Exam - grossly normal Sensory Exam - grossly  normal Reflexes:  Coordination - grossly normal Gait - grossly normal Balance - grossly normal Cranial Nerves: I: smell Not tested  II: visual acuity  OS: Normal  OD: Normal   II: visual fields Full to confrontation  II: pupils Equal, round, reactive to light  III,VII: ptosis None  III,IV,VI: extraocular muscles  Full ROM  V: mastication Normal  V: facial light touch sensation  Normal  V,VII: corneal reflex  Present  VII: facial muscle function - upper  Normal  VII: facial muscle function - lower Normal  VIII: hearing Not tested  IX: soft palate elevation  Normal  IX,X: gag reflex Present  XI: trapezius strength  5/5  XI: sternocleidomastoid strength 5/5  XI: neck flexion strength  5/5  XII: tongue strength  Normal    Data Review Lab Results  Component Value Date   WBC 7.0 06/13/2017   HGB 15.8 06/13/2017   HCT 44.9 06/13/2017   MCV 89.4 06/13/2017   PLT 208 06/13/2017   Lab Results  Component Value Date   NA 138 06/13/2017   K 3.4 (L) 06/13/2017   CL 105 06/13/2017   CO2 25 06/13/2017   BUN 12 06/13/2017   CREATININE 0.90 06/13/2017   GLUCOSE 109 (H) 06/13/2017   No results found for: INR, PROTIME  Assessment/Plan: l4-5 spinal stenosis, L2-3 herniated disks,lumbago, lumbar radiculopathy: I have discussed the situation with the patient. I have reviewed his imaging studies with him. We have discussed the various treatment options including surgery. I have described the surgical  treatment option of a L4-5 laminectomy in the left L2-3 discectomy. I have shown him surgical models. We have discussed the risks, benefits, alternatives, expected postoperative course, and likelihood of achieving our goals with surgery. I have answered all his questions.He has decided to proceed with surgery.   Iker Nuttall D 06/19/2017 7:19 AM

## 2017-06-19 NOTE — Discharge Instructions (Signed)

## 2017-06-19 NOTE — Evaluation (Signed)
Physical Therapy Evaluation Patient Details Name: Bryan Ellis MRN: [id number] DOB: 1956-03-08 Today's Date: 06/19/2017   History of Present Illness  Pt is a 61 y/o male s/p L2-3 discectomy and L4-5 laminectomy. PMH includes HTN, anxiety, and back surgery.   Clinical Impression  Patient is s/p above surgery resulting in the deficits listed below (see PT Problem List). PTA, pt was independent with functional mobility. Upon eval, pt presenting with slight unsteadiness and post op pain. Required min guard to supervision for mobility this session. Reports wife will be able to assist at d/c and has all necessary DME. Patient will benefit from skilled PT to increase their independence and safety with mobility (while adhering to their precautions) to allow discharge to the venue listed below. Will continue to follow acutely.      Follow Up Recommendations No PT follow up;Supervision - Intermittent    Equipment Recommendations  None recommended by PT    Recommendations for Other Services       Precautions / Restrictions Precautions Precautions: Back Precaution Booklet Issued: Yes (comment) Precaution Comments: Reviewed back precaution handout with pt.  Restrictions Weight Bearing Restrictions: No      Mobility  Bed Mobility Overal bed mobility: Needs Assistance Bed Mobility: Rolling;Sidelying to Sit Rolling: Supervision Sidelying to sit: Supervision       General bed mobility comments: Supervision to ensure appropriate log roll technique.   Transfers Overall transfer level: Needs assistance Equipment used: None Transfers: Sit to/from Stand Sit to Stand: Min guard         General transfer comment: Min guard for safety. Cues to power through LEs   Ambulation/Gait Ambulation/Gait assistance: Min guard;Supervision Ambulation Distance (Feet): 250 Feet Assistive device: None Gait Pattern/deviations: Step-through pattern;Decreased stride length Gait velocity:  Decreased Gait velocity interpretation: Below normal speed for age/gender General Gait Details: Slow, guarded, slightly unsteady gait. Min guard for safety initially secondary to instability, however, able to progress to supervision by end of gait training. Educated about generalized walking program to perform at home.   Stairs            Wheelchair Mobility    Modified Rankin (Stroke Patients Only)       Balance Overall balance assessment: Needs assistance Sitting-balance support: No upper extremity supported;Feet supported Sitting balance-Leahy Scale: Good     Standing balance support: No upper extremity supported;During functional activity Standing balance-Leahy Scale: Fair                               Pertinent Vitals/Pain Pain Assessment: Faces Faces Pain Scale: Hurts a little bit Pain Location: back  Pain Descriptors / Indicators: Operative site guarding;Grimacing Pain Intervention(s): Limited activity within patient's tolerance;Monitored during session;Repositioned    Home Living Family/patient expects to be discharged to:: Private residence Living Arrangements: Spouse/significant other Available Help at Discharge: Family;Available 24 hours/day Type of Home: House Home Access: Level entry     Home Layout: One level Home Equipment: Walker - 2 wheels;Cane - single point;Bedside commode      Prior Function Level of Independence: Independent               Hand Dominance        Extremity/Trunk Assessment   Upper Extremity Assessment Upper Extremity Assessment: Overall WFL for tasks assessed    Lower Extremity Assessment Lower Extremity Assessment: Overall WFL for tasks assessed    Cervical / Trunk Assessment Cervical / Trunk Assessment: Other exceptions  Cervical / Trunk Exceptions: s/p laminectomy  Communication   Communication: No difficulties  Cognition Arousal/Alertness: Awake/alert Behavior During Therapy: WFL for tasks  assessed/performed Overall Cognitive Status: Within Functional Limits for tasks assessed                                        General Comments General comments (skin integrity, edema, etc.): Pt's wife present during session. Educated pt and wife about maintaining precautions when performing ADL tasks.     Exercises     Assessment/Plan    PT Assessment Patient needs continued PT services  PT Problem List Decreased balance;Decreased mobility;Decreased knowledge of precautions;Pain       PT Treatment Interventions Gait training;Functional mobility training;Therapeutic activities;Therapeutic exercise;Balance training;Neuromuscular re-education;Patient/family education    PT Goals (Current goals can be found in the Care Plan section)  Acute Rehab PT Goals Patient Stated Goal: to go home today  PT Goal Formulation: With patient Time For Goal Achievement: 06/26/17 Potential to Achieve Goals: Good    Frequency Min 5X/week   Barriers to discharge        Co-evaluation               AM-PAC PT "6 Clicks" Daily Activity  Outcome Measure Difficulty turning over in bed (including adjusting bedclothes, sheets and blankets)?: None Difficulty moving from lying on back to sitting on the side of the bed? : None Difficulty sitting down on and standing up from a chair with arms (e.g., wheelchair, bedside commode, etc,.)?: Unable Help needed moving to and from a bed to chair (including a wheelchair)?: A Little Help needed walking in hospital room?: A Little Help needed climbing 3-5 steps with a railing? : A Little 6 Click Score: 18    End of Session Equipment Utilized During Treatment: Gait belt Activity Tolerance: Patient tolerated treatment well Patient left: in chair;with call bell/phone within reach;with family/visitor present Nurse Communication: Mobility status PT Visit Diagnosis: Unsteadiness on feet (R26.81);Other abnormalities of gait and mobility  (R26.89);Pain Pain - part of body:  (back )    Time: 6010-9323 PT Time Calculation (min) (ACUTE ONLY): 23 min   Charges:   PT Evaluation $PT Eval Low Complexity: 1 Low PT Treatments $Gait Training: 8-22 mins   PT G Codes:   PT G-Codes **NOT FOR INPATIENT CLASS** Functional Assessment Tool Used: AM-PAC 6 Clicks Basic Mobility;Clinical judgement Functional Limitation: Mobility: Walking and moving around Mobility: Walking and Moving Around Current Status (F5732): At least 40 percent but less than 60 percent impaired, limited or restricted Mobility: Walking and Moving Around Goal Status [phone]): At least 1 percent but less than 20 percent impaired, limited or restricted    Leighton Ruff, PT, DPT  Acute Rehabilitation Services  Pager: [place] 06/19/2017, 1:17 PM

## 2017-06-19 NOTE — Anesthesia Postprocedure Evaluation (Signed)
Anesthesia Post Note  Patient: Bryan Ellis  Procedure(s) Performed: LEFT MICRODISCECTOMY LUMBAR TWO- LUMBAR THREE, LUMBAR FOUR- LUMBAR FIVE LAMINECTOMY, LEFT (Left Back)     Patient location during evaluation: PACU Anesthesia Type: General Level of consciousness: awake and alert Pain management: pain level controlled Vital Signs Assessment: post-procedure vital signs reviewed and stable Respiratory status: spontaneous breathing, nonlabored ventilation, respiratory function stable and patient connected to nasal cannula oxygen Cardiovascular status: blood pressure returned to baseline and stable Postop Assessment: no apparent nausea or vomiting Anesthetic complications: no    Last Vitals:  Vitals:   06/19/17 1053 06/19/17 1114  BP:  (!) 141/76  Pulse: 61 62  Resp: 19 18  Temp:  36.6 C  SpO2: 99% 99%    Last Pain:  Vitals:   06/19/17 1030  TempSrc:   PainSc: 0-No pain                 Cashtyn Pouliot

## 2017-06-19 NOTE — Progress Notes (Signed)
Pt doing well. Pt and wife given D/C instructions with Rx's, verbal understanding was provided. Pt's incision is clean and dry with no sign of infection. Pt's IV was removed prior to D/C. Pt D/C'd home via wheelchair @ 1630 per MD order. Pt is stable @ D/C and has no other needs at this time. Holli Humbles, RN

## 2017-06-19 NOTE — Anesthesia Procedure Notes (Signed)
Procedure Name: Intubation Date/Time: 06/19/2017 7:37 AM Performed by: Jenne Campus Pre-anesthesia Checklist: Patient identified, Emergency Drugs available, Suction available and Patient being monitored Patient Re-evaluated:Patient Re-evaluated prior to induction Oxygen Delivery Method: Circle System Utilized Preoxygenation: Pre-oxygenation with 100% oxygen Induction Type: IV induction and Cricoid Pressure applied Ventilation: Mask ventilation without difficulty Laryngoscope Size: Miller and 3 Grade View: Grade I Tube type: Oral Tube size: 7.5 mm Number of attempts: 1 Airway Equipment and Method: Stylet and Oral airway Placement Confirmation: ETT inserted through vocal cords under direct vision,  positive ETCO2 and breath sounds checked- equal and bilateral Secured at: 22 cm Tube secured with: Tape Dental Injury: Teeth and Oropharynx as per pre-operative assessment

## 2017-06-19 NOTE — Discharge Summary (Signed)
Physician Discharge Summary  Patient ID: Bryan Ellis MRN: [id number] DOB/AGE: 02/23/1956 61 y.o.  Admit date: 06/19/2017 Discharge date: 06/19/2017  Admission Diagnoses: L4-5 spinal stenosis L2-3 herniated disc, lumbago, lumbar radiculopathy  Discharge Diagnoses: Same Active Problems:   Spinal stenosis of lumbar region with radiculopathy   Discharged Condition: good  Hospital Course: I performed bilateral L4-5 laminotomies/foraminotomies and left L2-3 discectomy on the patient on 06/19/2017. The surgery went well.  The patient's postoperative course was unremarkable. On the day of surgery the patient requested discharge to home. The patient, his wife, were given written and oral discharge instructions. All their questions were answered.  Consults: Physical therapy Significant Diagnostic Studies: None Treatments: Bilateral L4-5 laminotomies/foraminotomies and left L2-3 discectomy using microdissection. Discharge Exam: Blood pressure (!) 141/76, pulse 62, temperature 97.8 F (36.6 C), resp. rate 18, height 5\' 11"  (1.803 m), weight 86.7 kg (191 lb 3.2 oz), SpO2 99 %. The patient is alert and pleasant. He looks well. He is in no apparent distress. His strength is grossly normal in his lower extremities.  Disposition: Home  Discharge Instructions     Remove dressing in 72 hours    Complete by:  As directed    Call MD for:  difficulty breathing, headache or visual disturbances    Complete by:  As directed    Call MD for:  extreme fatigue    Complete by:  As directed    Call MD for:  hives    Complete by:  As directed    Call MD for:  persistant dizziness or light-headedness    Complete by:  As directed    Call MD for:  persistant nausea and vomiting    Complete by:  As directed    Call MD for:  redness, tenderness, or signs of infection (pain, swelling, redness, odor or green/yellow discharge around incision site)    Complete by:  As directed    Call MD for:  severe  uncontrolled pain    Complete by:  As directed    Call MD for:  temperature >100.4    Complete by:  As directed    Diet - low sodium heart healthy    Complete by:  As directed    Discharge instructions    Complete by:  As directed    Call [phone] for a followup appointment. Take a stool softener while you are using pain medications.   Driving Restrictions    Complete by:  As directed    Do not drive for 2 weeks.   Increase activity slowly    Complete by:  As directed    Lifting restrictions    Complete by:  As directed    Do not lift more than 5 pounds. No excessive bending or twisting.   May shower / Bathe    Complete by:  As directed    He may shower after the pain she is removed 3 days after surgery. Leave the incision alone.     Allergies as of 06/19/2017      Reactions   Blood-group Specific Substance Other (See Comments)   No blood products due to religion    Niacin And Related Other (See Comments)   unsure   Other Other (See Comments)   Quinaretic - racing heart    Prinivil [lisinopril] Other (See Comments)   Unsure      Medication List    TAKE these medications   amLODipine 2.5 MG tablet Commonly known as:  NORVASC Take 2.5  mg by mouth at bedtime.   aspirin EC 81 MG tablet Take 81 mg by mouth daily.   busPIRone 10 MG tablet Commonly known as:  BUSPAR Take 10 mg by mouth 2 (two) times daily as needed. For anxiety   carvedilol 12.5 MG tablet Commonly known as:  COREG Take 18.75 mg by mouth 2 (two) times daily.   cyclobenzaprine 10 MG tablet Commonly known as:  FLEXERIL Take 1 tablet (10 mg total) by mouth 3 (three) times daily as needed for muscle spasms.   docusate sodium 100 MG capsule Commonly known as:  COLACE Take 1 capsule (100 mg total) by mouth 2 (two) times daily.   ibuprofen 200 MG tablet Commonly known as:  ADVIL,MOTRIN Take 200 mg by mouth every 8 (eight) hours as needed (for back pain.).   KRILL OIL PO Take 1 capsule by mouth  daily.   meloxicam 15 MG tablet Commonly known as:  MOBIC Take 1 tablet (15 mg total) by mouth daily.   multivitamin with minerals Tabs tablet Take 1 tablet by mouth daily. Centrum   oxyCODONE 5 MG immediate release tablet Commonly known as:  Oxy IR/ROXICODONE Take 1 tablet (5 mg total) by mouth every 4 (four) hours as needed for moderate pain ((score 4 to 6)).   pantoprazole 40 MG tablet Commonly known as:  PROTONIX Take 40 mg by mouth daily before breakfast. 30 minutes before   PRESERVISION AREDS 2 Chew Chew 1 tablet by mouth 2 (two) times daily.   trolamine salicylate 10 % cream Commonly known as:  ASPERCREME Apply 1 application topically 3 (three) times daily as needed (for sore muscles).   valsartan-hydrochlorothiazide 320-25 MG tablet Commonly known as:  DIOVAN-HCT Take 1 tablet by mouth daily.        SignedOphelia Charter 06/19/2017, 3:48 PM

## 2017-06-19 NOTE — Op Note (Signed)
Brief history: The patient is a 61 year old white male who has complained of back and bilateral leg pain consistent with a lumbar radiculopathy. He has failed medical management and was worked up with a lumbar MRI. This demonstrated multilevel degenerative changes with stenosis at bilaterally L4-5 and a left-sided herniated disc at L2-3. I discussed the various treatment options with the patient. He decided to proceed with surgery after weighing the risks, benefits and alternatives.  Preoperative diagnosis:L4-5 spinal stenosis, left L2-3 herniated disc/spinal stenosis  Postoperative  diagnosithe same  Procedure: Bilateral L4-5 laminotomies/foraminotomies; left L2-3 Intervertebral discectomy using micro-dissection  Surgeon: Dr. Earle Gell  Asst.Dr. Pool  Anesthesia: Gen. endotracheal  Estimated blood loss100 mL  Drains: None  Complications: None  Description of procedure: The patient was brought to the operating room by the anesthesia team. General endotracheal anesthesia was induced. The patient was turned to the prone position on the Wilson frame. The patient's lumbosacral region was then prepared with Betadine scrub and Betadine solution. Sterile drapes were applied.  I then injected the area to be incised with Marcaine with epinephrine solution. I then used a scalpel to make a linear midline incision over the L2-3, L3-4 and L4-5 intervertebral disc space. I then used electrocautery to perforbilateral subperiosteal dissection exposing the spinous process and lamina of L2, L3, L4 and L5. We obtained intraoperative radiograph to confirm our location. I then inserted the [facility] retractor for exposure.  We then brought the operative microscope into the field. Under its magnification and illumination we completed the microdissection. I used a high-speed drill to perform a laminotomy at L4-5 bilaterally and L2-3 on the left. I then used a Kerrison punches to widen the laminotomy and removed  the ligamentum flavum at L4-5 bilaterally and L2-3 on the left. We then used microdissection to free up the thecal sac and the bilateral L5 and the left L3 nerve root from the epidural tissue. I then used a Kerrison punch to perform a foraminotomy at aboubilateral L5 and left L3 nerve root. We then using the nerve root retractor to gently retract the thecal sac and the left L3 nerve root medially. This exposed the intervertebral disc herniation. We identified the ruptured disc and remove it with the pituitary forcepwe inspected the intervertebral disc. There was a hole in the annulus but no impending herniation. We did not perform an intervertebral discectomy.  I then palpated along the ventral surface of the thecal sac and along exit route of the bilateral L5 and left L3  nerve root and noted that the neural structures were well decompressed. This completed the decompression.  We then obtained hemostasis using bipolar electrocautery. We irrigated the wound out with bacitracin solution. We then removed the retractor. We then reapproximated the patient's thoracolumbar fascia with interrupted #1 Vicryl suture. We then reapproximated the patient's subcutaneous tissue with interrupted 2-0 Vicryl suture. We then reapproximated patient's skin with Steri-Strips and benzoin. The was then coated with bacitracin ointment. The drapes were removed. The patient was subsequently returned to the supine position where they were extubated by the anesthesia team. The patient was then transported to the postanesthesia care unit in stable condition. All sponge instrument and needle counts were reportedly correct at the end of this case.

## 2017-06-20 ENCOUNTER — Encounter (HOSPITAL_COMMUNITY): Payer: Self-pay | Admitting: Neurosurgery

## 2018-06-05 DIAGNOSIS — Z23 Encounter for immunization: Secondary | ICD-10-CM | POA: Diagnosis not present

## 2018-06-12 DIAGNOSIS — K863 Pseudocyst of pancreas: Secondary | ICD-10-CM | POA: Diagnosis not present

## 2018-06-12 DIAGNOSIS — K760 Fatty (change of) liver, not elsewhere classified: Secondary | ICD-10-CM | POA: Diagnosis not present

## 2018-06-12 DIAGNOSIS — K862 Cyst of pancreas: Secondary | ICD-10-CM | POA: Diagnosis not present

## 2018-06-12 DIAGNOSIS — N281 Cyst of kidney, acquired: Secondary | ICD-10-CM | POA: Diagnosis not present

## 2018-07-16 DIAGNOSIS — Z1211 Encounter for screening for malignant neoplasm of colon: Secondary | ICD-10-CM | POA: Diagnosis not present

## 2018-07-16 DIAGNOSIS — Z86010 Personal history of colonic polyps: Secondary | ICD-10-CM | POA: Diagnosis not present

## 2018-10-02 DIAGNOSIS — Z6827 Body mass index (BMI) 27.0-27.9, adult: Secondary | ICD-10-CM | POA: Diagnosis not present

## 2018-10-02 DIAGNOSIS — J40 Bronchitis, not specified as acute or chronic: Secondary | ICD-10-CM | POA: Diagnosis not present

## 2018-10-02 DIAGNOSIS — J329 Chronic sinusitis, unspecified: Secondary | ICD-10-CM | POA: Diagnosis not present

## 2018-12-05 DIAGNOSIS — F419 Anxiety disorder, unspecified: Secondary | ICD-10-CM | POA: Diagnosis not present

## 2018-12-05 DIAGNOSIS — Z6827 Body mass index (BMI) 27.0-27.9, adult: Secondary | ICD-10-CM | POA: Diagnosis not present

## 2019-03-12 DIAGNOSIS — Z6827 Body mass index (BMI) 27.0-27.9, adult: Secondary | ICD-10-CM | POA: Diagnosis not present

## 2019-03-12 DIAGNOSIS — I10 Essential (primary) hypertension: Secondary | ICD-10-CM | POA: Diagnosis not present

## 2019-03-12 DIAGNOSIS — M5431 Sciatica, right side: Secondary | ICD-10-CM | POA: Diagnosis not present

## 2019-03-27 DIAGNOSIS — H353132 Nonexudative age-related macular degeneration, bilateral, intermediate dry stage: Secondary | ICD-10-CM | POA: Diagnosis not present

## 2019-04-15 DIAGNOSIS — Z79899 Other long term (current) drug therapy: Secondary | ICD-10-CM | POA: Diagnosis not present

## 2019-04-15 DIAGNOSIS — E785 Hyperlipidemia, unspecified: Secondary | ICD-10-CM | POA: Diagnosis not present

## 2019-04-15 DIAGNOSIS — Z125 Encounter for screening for malignant neoplasm of prostate: Secondary | ICD-10-CM | POA: Diagnosis not present

## 2019-04-19 DIAGNOSIS — F419 Anxiety disorder, unspecified: Secondary | ICD-10-CM | POA: Diagnosis not present

## 2019-04-19 DIAGNOSIS — I10 Essential (primary) hypertension: Secondary | ICD-10-CM | POA: Diagnosis not present

## 2019-04-19 DIAGNOSIS — K219 Gastro-esophageal reflux disease without esophagitis: Secondary | ICD-10-CM | POA: Diagnosis not present

## 2019-04-19 DIAGNOSIS — M5136 Other intervertebral disc degeneration, lumbar region: Secondary | ICD-10-CM | POA: Diagnosis not present

## 2019-06-10 DIAGNOSIS — K863 Pseudocyst of pancreas: Secondary | ICD-10-CM | POA: Diagnosis not present

## 2019-06-10 DIAGNOSIS — K862 Cyst of pancreas: Secondary | ICD-10-CM | POA: Diagnosis not present

## 2019-07-05 DIAGNOSIS — Z23 Encounter for immunization: Secondary | ICD-10-CM | POA: Diagnosis not present

## 2019-09-18 DIAGNOSIS — B079 Viral wart, unspecified: Secondary | ICD-10-CM | POA: Diagnosis not present

## 2019-09-23 DIAGNOSIS — Z1331 Encounter for screening for depression: Secondary | ICD-10-CM | POA: Diagnosis not present

## 2019-09-23 DIAGNOSIS — F419 Anxiety disorder, unspecified: Secondary | ICD-10-CM | POA: Diagnosis not present

## 2019-09-23 DIAGNOSIS — Z6827 Body mass index (BMI) 27.0-27.9, adult: Secondary | ICD-10-CM | POA: Diagnosis not present

## 2019-09-24 DIAGNOSIS — H353132 Nonexudative age-related macular degeneration, bilateral, intermediate dry stage: Secondary | ICD-10-CM | POA: Diagnosis not present

## 2020-02-27 DIAGNOSIS — D485 Neoplasm of uncertain behavior of skin: Secondary | ICD-10-CM | POA: Diagnosis not present

## 2020-03-12 DIAGNOSIS — E7800 Pure hypercholesterolemia, unspecified: Secondary | ICD-10-CM | POA: Diagnosis not present

## 2020-03-12 DIAGNOSIS — R7301 Impaired fasting glucose: Secondary | ICD-10-CM | POA: Diagnosis not present

## 2020-03-12 DIAGNOSIS — Z79899 Other long term (current) drug therapy: Secondary | ICD-10-CM | POA: Diagnosis not present

## 2020-03-19 DIAGNOSIS — Z6826 Body mass index (BMI) 26.0-26.9, adult: Secondary | ICD-10-CM | POA: Diagnosis not present

## 2020-03-19 DIAGNOSIS — I10 Essential (primary) hypertension: Secondary | ICD-10-CM | POA: Diagnosis not present

## 2020-03-19 DIAGNOSIS — Z1331 Encounter for screening for depression: Secondary | ICD-10-CM | POA: Diagnosis not present

## 2020-03-19 DIAGNOSIS — E7800 Pure hypercholesterolemia, unspecified: Secondary | ICD-10-CM | POA: Diagnosis not present

## 2020-04-15 DIAGNOSIS — E876 Hypokalemia: Secondary | ICD-10-CM | POA: Diagnosis not present

## 2020-04-30 DIAGNOSIS — J329 Chronic sinusitis, unspecified: Secondary | ICD-10-CM | POA: Diagnosis not present

## 2020-04-30 DIAGNOSIS — K219 Gastro-esophageal reflux disease without esophagitis: Secondary | ICD-10-CM | POA: Diagnosis not present

## 2020-04-30 DIAGNOSIS — J40 Bronchitis, not specified as acute or chronic: Secondary | ICD-10-CM | POA: Diagnosis not present

## 2020-05-25 DIAGNOSIS — H353132 Nonexudative age-related macular degeneration, bilateral, intermediate dry stage: Secondary | ICD-10-CM | POA: Diagnosis not present

## 2020-06-02 DIAGNOSIS — K760 Fatty (change of) liver, not elsewhere classified: Secondary | ICD-10-CM | POA: Diagnosis not present

## 2020-06-02 DIAGNOSIS — N281 Cyst of kidney, acquired: Secondary | ICD-10-CM | POA: Diagnosis not present

## 2020-06-02 DIAGNOSIS — K862 Cyst of pancreas: Secondary | ICD-10-CM | POA: Diagnosis not present

## 2020-06-02 DIAGNOSIS — K8689 Other specified diseases of pancreas: Secondary | ICD-10-CM | POA: Diagnosis not present

## 2020-06-04 DIAGNOSIS — Z23 Encounter for immunization: Secondary | ICD-10-CM | POA: Diagnosis not present

## 2020-06-08 DIAGNOSIS — H18413 Arcus senilis, bilateral: Secondary | ICD-10-CM | POA: Diagnosis not present

## 2020-06-08 DIAGNOSIS — H35363 Drusen (degenerative) of macula, bilateral: Secondary | ICD-10-CM | POA: Diagnosis not present

## 2020-06-08 DIAGNOSIS — H25013 Cortical age-related cataract, bilateral: Secondary | ICD-10-CM | POA: Diagnosis not present

## 2020-06-08 DIAGNOSIS — H353132 Nonexudative age-related macular degeneration, bilateral, intermediate dry stage: Secondary | ICD-10-CM | POA: Diagnosis not present

## 2020-06-08 DIAGNOSIS — H2513 Age-related nuclear cataract, bilateral: Secondary | ICD-10-CM | POA: Diagnosis not present

## 2020-06-08 DIAGNOSIS — H43393 Other vitreous opacities, bilateral: Secondary | ICD-10-CM | POA: Diagnosis not present

## 2020-06-08 DIAGNOSIS — H43813 Vitreous degeneration, bilateral: Secondary | ICD-10-CM | POA: Diagnosis not present

## 2020-06-08 DIAGNOSIS — H353210 Exudative age-related macular degeneration, right eye, stage unspecified: Secondary | ICD-10-CM | POA: Diagnosis not present

## 2020-06-24 DIAGNOSIS — H43813 Vitreous degeneration, bilateral: Secondary | ICD-10-CM | POA: Diagnosis not present

## 2020-06-24 DIAGNOSIS — H353112 Nonexudative age-related macular degeneration, right eye, intermediate dry stage: Secondary | ICD-10-CM | POA: Diagnosis not present

## 2020-06-24 DIAGNOSIS — H353222 Exudative age-related macular degeneration, left eye, with inactive choroidal neovascularization: Secondary | ICD-10-CM | POA: Diagnosis not present

## 2020-06-24 DIAGNOSIS — H43391 Other vitreous opacities, right eye: Secondary | ICD-10-CM | POA: Diagnosis not present

## 2020-07-09 DIAGNOSIS — K863 Pseudocyst of pancreas: Secondary | ICD-10-CM | POA: Diagnosis not present

## 2020-08-12 DIAGNOSIS — M26601 Right temporomandibular joint disorder, unspecified: Secondary | ICD-10-CM | POA: Diagnosis not present

## 2020-08-12 DIAGNOSIS — Z6827 Body mass index (BMI) 27.0-27.9, adult: Secondary | ICD-10-CM | POA: Diagnosis not present

## 2020-08-12 DIAGNOSIS — R6884 Jaw pain: Secondary | ICD-10-CM | POA: Diagnosis not present

## 2020-09-08 DIAGNOSIS — K862 Cyst of pancreas: Secondary | ICD-10-CM | POA: Diagnosis not present

## 2020-09-08 DIAGNOSIS — R12 Heartburn: Secondary | ICD-10-CM | POA: Diagnosis not present

## 2020-09-08 DIAGNOSIS — K863 Pseudocyst of pancreas: Secondary | ICD-10-CM | POA: Diagnosis not present

## 2020-09-23 DIAGNOSIS — H43813 Vitreous degeneration, bilateral: Secondary | ICD-10-CM | POA: Diagnosis not present

## 2020-09-23 DIAGNOSIS — H353221 Exudative age-related macular degeneration, left eye, with active choroidal neovascularization: Secondary | ICD-10-CM | POA: Diagnosis not present

## 2020-09-23 DIAGNOSIS — H353114 Nonexudative age-related macular degeneration, right eye, advanced atrophic with subfoveal involvement: Secondary | ICD-10-CM | POA: Diagnosis not present

## 2020-10-15 DIAGNOSIS — D485 Neoplasm of uncertain behavior of skin: Secondary | ICD-10-CM | POA: Diagnosis not present

## 2020-10-15 DIAGNOSIS — L820 Inflamed seborrheic keratosis: Secondary | ICD-10-CM | POA: Diagnosis not present

## 2020-10-15 DIAGNOSIS — L821 Other seborrheic keratosis: Secondary | ICD-10-CM | POA: Diagnosis not present

## 2020-10-21 DIAGNOSIS — H353221 Exudative age-related macular degeneration, left eye, with active choroidal neovascularization: Secondary | ICD-10-CM | POA: Diagnosis not present

## 2020-10-21 DIAGNOSIS — H43813 Vitreous degeneration, bilateral: Secondary | ICD-10-CM | POA: Diagnosis not present

## 2020-10-21 DIAGNOSIS — H43391 Other vitreous opacities, right eye: Secondary | ICD-10-CM | POA: Diagnosis not present

## 2020-10-21 DIAGNOSIS — H353114 Nonexudative age-related macular degeneration, right eye, advanced atrophic with subfoveal involvement: Secondary | ICD-10-CM | POA: Diagnosis not present

## 2020-11-10 DIAGNOSIS — H25013 Cortical age-related cataract, bilateral: Secondary | ICD-10-CM | POA: Diagnosis not present

## 2020-11-10 DIAGNOSIS — H353210 Exudative age-related macular degeneration, right eye, stage unspecified: Secondary | ICD-10-CM | POA: Diagnosis not present

## 2020-11-10 DIAGNOSIS — H18413 Arcus senilis, bilateral: Secondary | ICD-10-CM | POA: Diagnosis not present

## 2020-11-10 DIAGNOSIS — H2513 Age-related nuclear cataract, bilateral: Secondary | ICD-10-CM | POA: Diagnosis not present

## 2020-11-10 DIAGNOSIS — H43813 Vitreous degeneration, bilateral: Secondary | ICD-10-CM | POA: Diagnosis not present

## 2020-11-10 DIAGNOSIS — H35363 Drusen (degenerative) of macula, bilateral: Secondary | ICD-10-CM | POA: Diagnosis not present

## 2020-11-10 DIAGNOSIS — H353112 Nonexudative age-related macular degeneration, right eye, intermediate dry stage: Secondary | ICD-10-CM | POA: Diagnosis not present

## 2020-11-10 DIAGNOSIS — H43393 Other vitreous opacities, bilateral: Secondary | ICD-10-CM | POA: Diagnosis not present

## 2020-11-23 DIAGNOSIS — H353114 Nonexudative age-related macular degeneration, right eye, advanced atrophic with subfoveal involvement: Secondary | ICD-10-CM | POA: Diagnosis not present

## 2020-11-23 DIAGNOSIS — H353221 Exudative age-related macular degeneration, left eye, with active choroidal neovascularization: Secondary | ICD-10-CM | POA: Diagnosis not present

## 2020-12-23 DIAGNOSIS — H353221 Exudative age-related macular degeneration, left eye, with active choroidal neovascularization: Secondary | ICD-10-CM | POA: Diagnosis not present

## 2020-12-23 DIAGNOSIS — H43391 Other vitreous opacities, right eye: Secondary | ICD-10-CM | POA: Diagnosis not present

## 2020-12-23 DIAGNOSIS — H43813 Vitreous degeneration, bilateral: Secondary | ICD-10-CM | POA: Diagnosis not present

## 2020-12-23 DIAGNOSIS — H353114 Nonexudative age-related macular degeneration, right eye, advanced atrophic with subfoveal involvement: Secondary | ICD-10-CM | POA: Diagnosis not present

## 2021-01-01 DIAGNOSIS — Z79899 Other long term (current) drug therapy: Secondary | ICD-10-CM | POA: Diagnosis not present

## 2021-01-01 DIAGNOSIS — Z125 Encounter for screening for malignant neoplasm of prostate: Secondary | ICD-10-CM | POA: Diagnosis not present

## 2021-01-01 DIAGNOSIS — E7800 Pure hypercholesterolemia, unspecified: Secondary | ICD-10-CM | POA: Diagnosis not present

## 2021-01-08 DIAGNOSIS — Z0000 Encounter for general adult medical examination without abnormal findings: Secondary | ICD-10-CM | POA: Diagnosis not present

## 2021-01-25 DIAGNOSIS — I10 Essential (primary) hypertension: Secondary | ICD-10-CM | POA: Diagnosis not present

## 2021-01-25 DIAGNOSIS — E7800 Pure hypercholesterolemia, unspecified: Secondary | ICD-10-CM | POA: Diagnosis not present

## 2021-01-25 DIAGNOSIS — F419 Anxiety disorder, unspecified: Secondary | ICD-10-CM | POA: Diagnosis not present

## 2021-01-26 DIAGNOSIS — H2512 Age-related nuclear cataract, left eye: Secondary | ICD-10-CM | POA: Diagnosis not present

## 2021-01-26 DIAGNOSIS — H25043 Posterior subcapsular polar age-related cataract, bilateral: Secondary | ICD-10-CM | POA: Diagnosis not present

## 2021-01-26 DIAGNOSIS — H2513 Age-related nuclear cataract, bilateral: Secondary | ICD-10-CM | POA: Diagnosis not present

## 2021-01-26 DIAGNOSIS — H25013 Cortical age-related cataract, bilateral: Secondary | ICD-10-CM | POA: Diagnosis not present

## 2021-01-26 DIAGNOSIS — H18413 Arcus senilis, bilateral: Secondary | ICD-10-CM | POA: Diagnosis not present

## 2021-02-05 DIAGNOSIS — H353114 Nonexudative age-related macular degeneration, right eye, advanced atrophic with subfoveal involvement: Secondary | ICD-10-CM | POA: Diagnosis not present

## 2021-02-05 DIAGNOSIS — H43391 Other vitreous opacities, right eye: Secondary | ICD-10-CM | POA: Diagnosis not present

## 2021-02-05 DIAGNOSIS — H353221 Exudative age-related macular degeneration, left eye, with active choroidal neovascularization: Secondary | ICD-10-CM | POA: Diagnosis not present

## 2021-02-05 DIAGNOSIS — H43813 Vitreous degeneration, bilateral: Secondary | ICD-10-CM | POA: Diagnosis not present

## 2021-03-08 DIAGNOSIS — H353222 Exudative age-related macular degeneration, left eye, with inactive choroidal neovascularization: Secondary | ICD-10-CM | POA: Diagnosis not present

## 2021-03-08 DIAGNOSIS — H43392 Other vitreous opacities, left eye: Secondary | ICD-10-CM | POA: Diagnosis not present

## 2021-03-08 DIAGNOSIS — H43312 Vitreous membranes and strands, left eye: Secondary | ICD-10-CM | POA: Diagnosis not present

## 2021-03-08 DIAGNOSIS — H353221 Exudative age-related macular degeneration, left eye, with active choroidal neovascularization: Secondary | ICD-10-CM | POA: Diagnosis not present

## 2021-03-08 DIAGNOSIS — H2512 Age-related nuclear cataract, left eye: Secondary | ICD-10-CM | POA: Diagnosis not present

## 2021-04-02 DIAGNOSIS — I10 Essential (primary) hypertension: Secondary | ICD-10-CM | POA: Diagnosis not present

## 2021-04-02 DIAGNOSIS — E7800 Pure hypercholesterolemia, unspecified: Secondary | ICD-10-CM | POA: Diagnosis not present

## 2021-04-05 DIAGNOSIS — H43391 Other vitreous opacities, right eye: Secondary | ICD-10-CM | POA: Diagnosis not present

## 2021-04-05 DIAGNOSIS — H43311 Vitreous membranes and strands, right eye: Secondary | ICD-10-CM | POA: Diagnosis not present

## 2021-04-05 DIAGNOSIS — H2511 Age-related nuclear cataract, right eye: Secondary | ICD-10-CM | POA: Diagnosis not present

## 2021-04-06 DIAGNOSIS — H353221 Exudative age-related macular degeneration, left eye, with active choroidal neovascularization: Secondary | ICD-10-CM | POA: Diagnosis not present

## 2021-04-06 DIAGNOSIS — H353114 Nonexudative age-related macular degeneration, right eye, advanced atrophic with subfoveal involvement: Secondary | ICD-10-CM | POA: Diagnosis not present

## 2021-04-09 DIAGNOSIS — R200 Anesthesia of skin: Secondary | ICD-10-CM | POA: Diagnosis not present

## 2021-04-09 DIAGNOSIS — R29898 Other symptoms and signs involving the musculoskeletal system: Secondary | ICD-10-CM | POA: Diagnosis not present

## 2021-04-09 DIAGNOSIS — Z1331 Encounter for screening for depression: Secondary | ICD-10-CM | POA: Diagnosis not present

## 2021-04-09 DIAGNOSIS — R29818 Other symptoms and signs involving the nervous system: Secondary | ICD-10-CM | POA: Diagnosis not present

## 2021-04-09 DIAGNOSIS — Z6827 Body mass index (BMI) 27.0-27.9, adult: Secondary | ICD-10-CM | POA: Diagnosis not present

## 2021-04-13 DIAGNOSIS — R200 Anesthesia of skin: Secondary | ICD-10-CM | POA: Diagnosis not present

## 2021-04-13 DIAGNOSIS — M47812 Spondylosis without myelopathy or radiculopathy, cervical region: Secondary | ICD-10-CM | POA: Diagnosis not present

## 2021-04-13 DIAGNOSIS — H353221 Exudative age-related macular degeneration, left eye, with active choroidal neovascularization: Secondary | ICD-10-CM | POA: Diagnosis not present

## 2021-04-20 DIAGNOSIS — M50223 Other cervical disc displacement at C6-C7 level: Secondary | ICD-10-CM | POA: Diagnosis not present

## 2021-04-20 DIAGNOSIS — M4802 Spinal stenosis, cervical region: Secondary | ICD-10-CM | POA: Diagnosis not present

## 2021-04-20 DIAGNOSIS — R200 Anesthesia of skin: Secondary | ICD-10-CM | POA: Diagnosis not present

## 2021-04-20 DIAGNOSIS — M50221 Other cervical disc displacement at C4-C5 level: Secondary | ICD-10-CM | POA: Diagnosis not present

## 2021-04-27 DIAGNOSIS — I10 Essential (primary) hypertension: Secondary | ICD-10-CM | POA: Diagnosis not present

## 2021-04-27 DIAGNOSIS — Z6827 Body mass index (BMI) 27.0-27.9, adult: Secondary | ICD-10-CM | POA: Diagnosis not present

## 2021-04-27 DIAGNOSIS — M4802 Spinal stenosis, cervical region: Secondary | ICD-10-CM | POA: Diagnosis not present

## 2021-04-27 DIAGNOSIS — M4712 Other spondylosis with myelopathy, cervical region: Secondary | ICD-10-CM | POA: Diagnosis not present

## 2021-05-05 DIAGNOSIS — H353221 Exudative age-related macular degeneration, left eye, with active choroidal neovascularization: Secondary | ICD-10-CM | POA: Diagnosis not present

## 2021-05-05 DIAGNOSIS — H353112 Nonexudative age-related macular degeneration, right eye, intermediate dry stage: Secondary | ICD-10-CM | POA: Diagnosis not present

## 2021-05-10 DIAGNOSIS — Z23 Encounter for immunization: Secondary | ICD-10-CM | POA: Diagnosis not present

## 2021-05-18 DIAGNOSIS — H353221 Exudative age-related macular degeneration, left eye, with active choroidal neovascularization: Secondary | ICD-10-CM | POA: Diagnosis not present

## 2021-05-18 DIAGNOSIS — H353112 Nonexudative age-related macular degeneration, right eye, intermediate dry stage: Secondary | ICD-10-CM | POA: Diagnosis not present

## 2021-05-26 DIAGNOSIS — M4712 Other spondylosis with myelopathy, cervical region: Secondary | ICD-10-CM | POA: Diagnosis not present

## 2021-05-26 DIAGNOSIS — M4802 Spinal stenosis, cervical region: Secondary | ICD-10-CM | POA: Diagnosis not present

## 2021-06-15 DIAGNOSIS — M47812 Spondylosis without myelopathy or radiculopathy, cervical region: Secondary | ICD-10-CM | POA: Diagnosis not present

## 2021-06-16 DIAGNOSIS — H353222 Exudative age-related macular degeneration, left eye, with inactive choroidal neovascularization: Secondary | ICD-10-CM | POA: Diagnosis not present

## 2021-06-16 DIAGNOSIS — H353211 Exudative age-related macular degeneration, right eye, with active choroidal neovascularization: Secondary | ICD-10-CM | POA: Diagnosis not present

## 2021-06-28 DIAGNOSIS — I10 Essential (primary) hypertension: Secondary | ICD-10-CM | POA: Diagnosis not present

## 2021-06-28 DIAGNOSIS — E7800 Pure hypercholesterolemia, unspecified: Secondary | ICD-10-CM | POA: Diagnosis not present

## 2021-06-28 DIAGNOSIS — K219 Gastro-esophageal reflux disease without esophagitis: Secondary | ICD-10-CM | POA: Diagnosis not present

## 2021-06-30 DIAGNOSIS — R059 Cough, unspecified: Secondary | ICD-10-CM | POA: Diagnosis not present

## 2021-07-02 DIAGNOSIS — E7800 Pure hypercholesterolemia, unspecified: Secondary | ICD-10-CM | POA: Diagnosis not present

## 2021-07-02 DIAGNOSIS — Z79899 Other long term (current) drug therapy: Secondary | ICD-10-CM | POA: Diagnosis not present

## 2021-07-07 DIAGNOSIS — K219 Gastro-esophageal reflux disease without esophagitis: Secondary | ICD-10-CM | POA: Diagnosis not present

## 2021-07-07 DIAGNOSIS — F419 Anxiety disorder, unspecified: Secondary | ICD-10-CM | POA: Diagnosis not present

## 2021-07-07 DIAGNOSIS — Z23 Encounter for immunization: Secondary | ICD-10-CM | POA: Diagnosis not present

## 2021-07-07 DIAGNOSIS — Z6827 Body mass index (BMI) 27.0-27.9, adult: Secondary | ICD-10-CM | POA: Diagnosis not present

## 2021-07-07 DIAGNOSIS — I10 Essential (primary) hypertension: Secondary | ICD-10-CM | POA: Diagnosis not present

## 2021-07-07 DIAGNOSIS — E119 Type 2 diabetes mellitus without complications: Secondary | ICD-10-CM | POA: Diagnosis not present

## 2021-07-12 DIAGNOSIS — R1031 Right lower quadrant pain: Secondary | ICD-10-CM | POA: Diagnosis not present

## 2021-07-12 DIAGNOSIS — L600 Ingrowing nail: Secondary | ICD-10-CM | POA: Diagnosis not present

## 2021-07-28 DIAGNOSIS — Z9889 Other specified postprocedural states: Secondary | ICD-10-CM | POA: Diagnosis not present

## 2021-08-11 DIAGNOSIS — H353231 Exudative age-related macular degeneration, bilateral, with active choroidal neovascularization: Secondary | ICD-10-CM | POA: Diagnosis not present

## 2021-08-16 DIAGNOSIS — L300 Nummular dermatitis: Secondary | ICD-10-CM | POA: Diagnosis not present

## 2021-09-10 DIAGNOSIS — H353231 Exudative age-related macular degeneration, bilateral, with active choroidal neovascularization: Secondary | ICD-10-CM | POA: Diagnosis not present

## 2021-09-24 DIAGNOSIS — M4712 Other spondylosis with myelopathy, cervical region: Secondary | ICD-10-CM | POA: Diagnosis not present

## 2021-09-24 DIAGNOSIS — M4802 Spinal stenosis, cervical region: Secondary | ICD-10-CM | POA: Diagnosis not present

## 2021-10-12 DIAGNOSIS — H353231 Exudative age-related macular degeneration, bilateral, with active choroidal neovascularization: Secondary | ICD-10-CM | POA: Diagnosis not present

## 2021-11-09 DIAGNOSIS — H353231 Exudative age-related macular degeneration, bilateral, with active choroidal neovascularization: Secondary | ICD-10-CM | POA: Diagnosis not present

## 2021-12-07 DIAGNOSIS — H353231 Exudative age-related macular degeneration, bilateral, with active choroidal neovascularization: Secondary | ICD-10-CM | POA: Diagnosis not present

## 2021-12-14 DIAGNOSIS — E7800 Pure hypercholesterolemia, unspecified: Secondary | ICD-10-CM | POA: Diagnosis not present

## 2021-12-17 DIAGNOSIS — Z6828 Body mass index (BMI) 28.0-28.9, adult: Secondary | ICD-10-CM | POA: Diagnosis not present

## 2021-12-17 DIAGNOSIS — E119 Type 2 diabetes mellitus without complications: Secondary | ICD-10-CM | POA: Diagnosis not present

## 2021-12-17 DIAGNOSIS — I10 Essential (primary) hypertension: Secondary | ICD-10-CM | POA: Diagnosis not present

## 2021-12-17 DIAGNOSIS — F419 Anxiety disorder, unspecified: Secondary | ICD-10-CM | POA: Diagnosis not present

## 2021-12-17 DIAGNOSIS — E7800 Pure hypercholesterolemia, unspecified: Secondary | ICD-10-CM | POA: Diagnosis not present

## 2022-01-05 DIAGNOSIS — H353114 Nonexudative age-related macular degeneration, right eye, advanced atrophic with subfoveal involvement: Secondary | ICD-10-CM | POA: Diagnosis not present

## 2022-01-05 DIAGNOSIS — H43811 Vitreous degeneration, right eye: Secondary | ICD-10-CM | POA: Diagnosis not present

## 2022-01-05 DIAGNOSIS — H353231 Exudative age-related macular degeneration, bilateral, with active choroidal neovascularization: Secondary | ICD-10-CM | POA: Diagnosis not present

## 2022-01-05 DIAGNOSIS — H353123 Nonexudative age-related macular degeneration, left eye, advanced atrophic without subfoveal involvement: Secondary | ICD-10-CM | POA: Diagnosis not present

## 2022-02-22 DIAGNOSIS — H353211 Exudative age-related macular degeneration, right eye, with active choroidal neovascularization: Secondary | ICD-10-CM | POA: Diagnosis not present

## 2022-02-22 DIAGNOSIS — H353231 Exudative age-related macular degeneration, bilateral, with active choroidal neovascularization: Secondary | ICD-10-CM | POA: Diagnosis not present

## 2022-02-22 DIAGNOSIS — H43811 Vitreous degeneration, right eye: Secondary | ICD-10-CM | POA: Diagnosis not present

## 2022-02-22 DIAGNOSIS — H353221 Exudative age-related macular degeneration, left eye, with active choroidal neovascularization: Secondary | ICD-10-CM | POA: Diagnosis not present

## 2022-03-31 DIAGNOSIS — F3342 Major depressive disorder, recurrent, in full remission: Secondary | ICD-10-CM | POA: Diagnosis not present

## 2022-03-31 DIAGNOSIS — K862 Cyst of pancreas: Secondary | ICD-10-CM | POA: Diagnosis not present

## 2022-03-31 DIAGNOSIS — E663 Overweight: Secondary | ICD-10-CM | POA: Diagnosis not present

## 2022-03-31 DIAGNOSIS — E785 Hyperlipidemia, unspecified: Secondary | ICD-10-CM | POA: Diagnosis not present

## 2022-03-31 DIAGNOSIS — Z9849 Cataract extraction status, unspecified eye: Secondary | ICD-10-CM | POA: Diagnosis not present

## 2022-03-31 DIAGNOSIS — Z87891 Personal history of nicotine dependence: Secondary | ICD-10-CM | POA: Diagnosis not present

## 2022-03-31 DIAGNOSIS — H353231 Exudative age-related macular degeneration, bilateral, with active choroidal neovascularization: Secondary | ICD-10-CM | POA: Diagnosis not present

## 2022-03-31 DIAGNOSIS — I10 Essential (primary) hypertension: Secondary | ICD-10-CM | POA: Diagnosis not present

## 2022-03-31 DIAGNOSIS — M1990 Unspecified osteoarthritis, unspecified site: Secondary | ICD-10-CM | POA: Diagnosis not present

## 2022-03-31 DIAGNOSIS — F419 Anxiety disorder, unspecified: Secondary | ICD-10-CM | POA: Diagnosis not present

## 2022-03-31 DIAGNOSIS — H3530 Unspecified macular degeneration: Secondary | ICD-10-CM | POA: Diagnosis not present

## 2022-03-31 DIAGNOSIS — K219 Gastro-esophageal reflux disease without esophagitis: Secondary | ICD-10-CM | POA: Diagnosis not present

## 2022-04-15 DIAGNOSIS — H353231 Exudative age-related macular degeneration, bilateral, with active choroidal neovascularization: Secondary | ICD-10-CM | POA: Diagnosis not present

## 2022-04-15 DIAGNOSIS — H43811 Vitreous degeneration, right eye: Secondary | ICD-10-CM | POA: Diagnosis not present

## 2022-04-15 DIAGNOSIS — H353211 Exudative age-related macular degeneration, right eye, with active choroidal neovascularization: Secondary | ICD-10-CM | POA: Diagnosis not present

## 2022-04-15 DIAGNOSIS — H353221 Exudative age-related macular degeneration, left eye, with active choroidal neovascularization: Secondary | ICD-10-CM | POA: Diagnosis not present

## 2022-04-19 DIAGNOSIS — M4712 Other spondylosis with myelopathy, cervical region: Secondary | ICD-10-CM | POA: Diagnosis not present

## 2022-05-06 DIAGNOSIS — J40 Bronchitis, not specified as acute or chronic: Secondary | ICD-10-CM | POA: Diagnosis not present

## 2022-05-06 DIAGNOSIS — J329 Chronic sinusitis, unspecified: Secondary | ICD-10-CM | POA: Diagnosis not present

## 2022-05-06 DIAGNOSIS — U071 COVID-19: Secondary | ICD-10-CM | POA: Diagnosis not present

## 2022-06-02 DIAGNOSIS — E1169 Type 2 diabetes mellitus with other specified complication: Secondary | ICD-10-CM | POA: Diagnosis not present

## 2022-06-02 DIAGNOSIS — E7800 Pure hypercholesterolemia, unspecified: Secondary | ICD-10-CM | POA: Diagnosis not present

## 2022-06-06 DIAGNOSIS — I10 Essential (primary) hypertension: Secondary | ICD-10-CM | POA: Diagnosis not present

## 2022-06-06 DIAGNOSIS — Z6827 Body mass index (BMI) 27.0-27.9, adult: Secondary | ICD-10-CM | POA: Diagnosis not present

## 2022-06-06 DIAGNOSIS — Z0000 Encounter for general adult medical examination without abnormal findings: Secondary | ICD-10-CM | POA: Diagnosis not present

## 2022-06-06 DIAGNOSIS — Z1331 Encounter for screening for depression: Secondary | ICD-10-CM | POA: Diagnosis not present

## 2022-06-06 DIAGNOSIS — E119 Type 2 diabetes mellitus without complications: Secondary | ICD-10-CM | POA: Diagnosis not present

## 2022-06-06 DIAGNOSIS — E7800 Pure hypercholesterolemia, unspecified: Secondary | ICD-10-CM | POA: Diagnosis not present

## 2022-06-06 DIAGNOSIS — Z23 Encounter for immunization: Secondary | ICD-10-CM | POA: Diagnosis not present

## 2022-06-06 DIAGNOSIS — F419 Anxiety disorder, unspecified: Secondary | ICD-10-CM | POA: Diagnosis not present

## 2022-06-10 DIAGNOSIS — H353231 Exudative age-related macular degeneration, bilateral, with active choroidal neovascularization: Secondary | ICD-10-CM | POA: Diagnosis not present

## 2022-06-10 DIAGNOSIS — H43811 Vitreous degeneration, right eye: Secondary | ICD-10-CM | POA: Diagnosis not present

## 2022-08-10 DIAGNOSIS — H43811 Vitreous degeneration, right eye: Secondary | ICD-10-CM | POA: Diagnosis not present

## 2022-08-10 DIAGNOSIS — H353231 Exudative age-related macular degeneration, bilateral, with active choroidal neovascularization: Secondary | ICD-10-CM | POA: Diagnosis not present

## 2022-08-10 DIAGNOSIS — Z961 Presence of intraocular lens: Secondary | ICD-10-CM | POA: Diagnosis not present

## 2022-10-25 DIAGNOSIS — M4712 Other spondylosis with myelopathy, cervical region: Secondary | ICD-10-CM | POA: Diagnosis not present

## 2022-10-26 DIAGNOSIS — H43813 Vitreous degeneration, bilateral: Secondary | ICD-10-CM | POA: Diagnosis not present

## 2022-10-26 DIAGNOSIS — Z961 Presence of intraocular lens: Secondary | ICD-10-CM | POA: Diagnosis not present

## 2022-10-26 DIAGNOSIS — H353231 Exudative age-related macular degeneration, bilateral, with active choroidal neovascularization: Secondary | ICD-10-CM | POA: Diagnosis not present

## 2022-10-26 DIAGNOSIS — I10 Essential (primary) hypertension: Secondary | ICD-10-CM | POA: Diagnosis not present

## 2022-12-12 DIAGNOSIS — E119 Type 2 diabetes mellitus without complications: Secondary | ICD-10-CM | POA: Diagnosis not present

## 2022-12-15 DIAGNOSIS — Z6827 Body mass index (BMI) 27.0-27.9, adult: Secondary | ICD-10-CM | POA: Diagnosis not present

## 2022-12-15 DIAGNOSIS — E119 Type 2 diabetes mellitus without complications: Secondary | ICD-10-CM | POA: Diagnosis not present

## 2022-12-15 DIAGNOSIS — I10 Essential (primary) hypertension: Secondary | ICD-10-CM | POA: Diagnosis not present

## 2022-12-15 DIAGNOSIS — K219 Gastro-esophageal reflux disease without esophagitis: Secondary | ICD-10-CM | POA: Diagnosis not present

## 2022-12-15 DIAGNOSIS — E7800 Pure hypercholesterolemia, unspecified: Secondary | ICD-10-CM | POA: Diagnosis not present

## 2023-01-04 DIAGNOSIS — Z6827 Body mass index (BMI) 27.0-27.9, adult: Secondary | ICD-10-CM | POA: Diagnosis not present

## 2023-01-04 DIAGNOSIS — N5089 Other specified disorders of the male genital organs: Secondary | ICD-10-CM | POA: Diagnosis not present

## 2023-01-10 DIAGNOSIS — N503 Cyst of epididymis: Secondary | ICD-10-CM | POA: Diagnosis not present

## 2023-01-10 DIAGNOSIS — N433 Hydrocele, unspecified: Secondary | ICD-10-CM | POA: Diagnosis not present

## 2023-01-10 DIAGNOSIS — N5089 Other specified disorders of the male genital organs: Secondary | ICD-10-CM | POA: Diagnosis not present

## 2023-01-25 DIAGNOSIS — H353231 Exudative age-related macular degeneration, bilateral, with active choroidal neovascularization: Secondary | ICD-10-CM | POA: Diagnosis not present

## 2023-01-25 DIAGNOSIS — Z961 Presence of intraocular lens: Secondary | ICD-10-CM | POA: Diagnosis not present

## 2023-01-25 DIAGNOSIS — H43813 Vitreous degeneration, bilateral: Secondary | ICD-10-CM | POA: Diagnosis not present

## 2023-02-15 DIAGNOSIS — Z139 Encounter for screening, unspecified: Secondary | ICD-10-CM | POA: Diagnosis not present

## 2023-02-15 DIAGNOSIS — Z6827 Body mass index (BMI) 27.0-27.9, adult: Secondary | ICD-10-CM | POA: Diagnosis not present

## 2023-02-15 DIAGNOSIS — R197 Diarrhea, unspecified: Secondary | ICD-10-CM | POA: Diagnosis not present

## 2023-02-24 DIAGNOSIS — N442 Benign cyst of testis: Secondary | ICD-10-CM | POA: Diagnosis not present

## 2023-05-31 DIAGNOSIS — H43813 Vitreous degeneration, bilateral: Secondary | ICD-10-CM | POA: Diagnosis not present

## 2023-05-31 DIAGNOSIS — H353133 Nonexudative age-related macular degeneration, bilateral, advanced atrophic without subfoveal involvement: Secondary | ICD-10-CM | POA: Diagnosis not present

## 2023-05-31 DIAGNOSIS — H353232 Exudative age-related macular degeneration, bilateral, with inactive choroidal neovascularization: Secondary | ICD-10-CM | POA: Diagnosis not present

## 2023-06-02 DIAGNOSIS — Z23 Encounter for immunization: Secondary | ICD-10-CM | POA: Diagnosis not present

## 2023-06-14 DIAGNOSIS — K760 Fatty (change of) liver, not elsewhere classified: Secondary | ICD-10-CM | POA: Diagnosis not present

## 2023-06-14 DIAGNOSIS — K649 Unspecified hemorrhoids: Secondary | ICD-10-CM | POA: Diagnosis not present

## 2023-06-14 DIAGNOSIS — K219 Gastro-esophageal reflux disease without esophagitis: Secondary | ICD-10-CM | POA: Diagnosis not present

## 2023-06-27 DIAGNOSIS — E119 Type 2 diabetes mellitus without complications: Secondary | ICD-10-CM | POA: Diagnosis not present

## 2023-06-29 DIAGNOSIS — L209 Atopic dermatitis, unspecified: Secondary | ICD-10-CM | POA: Diagnosis not present

## 2023-06-29 DIAGNOSIS — L299 Pruritus, unspecified: Secondary | ICD-10-CM | POA: Diagnosis not present

## 2023-06-29 DIAGNOSIS — L820 Inflamed seborrheic keratosis: Secondary | ICD-10-CM | POA: Diagnosis not present

## 2023-06-29 DIAGNOSIS — L814 Other melanin hyperpigmentation: Secondary | ICD-10-CM | POA: Diagnosis not present

## 2023-06-29 DIAGNOSIS — L578 Other skin changes due to chronic exposure to nonionizing radiation: Secondary | ICD-10-CM | POA: Diagnosis not present

## 2023-06-29 DIAGNOSIS — L570 Actinic keratosis: Secondary | ICD-10-CM | POA: Diagnosis not present

## 2023-07-03 DIAGNOSIS — K769 Liver disease, unspecified: Secondary | ICD-10-CM | POA: Diagnosis not present

## 2023-07-03 DIAGNOSIS — K862 Cyst of pancreas: Secondary | ICD-10-CM | POA: Diagnosis not present

## 2023-07-03 DIAGNOSIS — K760 Fatty (change of) liver, not elsewhere classified: Secondary | ICD-10-CM | POA: Diagnosis not present

## 2023-07-07 DIAGNOSIS — Z1331 Encounter for screening for depression: Secondary | ICD-10-CM | POA: Diagnosis not present

## 2023-07-07 DIAGNOSIS — I10 Essential (primary) hypertension: Secondary | ICD-10-CM | POA: Diagnosis not present

## 2023-07-07 DIAGNOSIS — Z6826 Body mass index (BMI) 26.0-26.9, adult: Secondary | ICD-10-CM | POA: Diagnosis not present

## 2023-07-07 DIAGNOSIS — Z1339 Encounter for screening examination for other mental health and behavioral disorders: Secondary | ICD-10-CM | POA: Diagnosis not present

## 2023-07-07 DIAGNOSIS — E119 Type 2 diabetes mellitus without complications: Secondary | ICD-10-CM | POA: Diagnosis not present

## 2023-07-07 DIAGNOSIS — Z0000 Encounter for general adult medical examination without abnormal findings: Secondary | ICD-10-CM | POA: Diagnosis not present

## 2023-07-18 DIAGNOSIS — K760 Fatty (change of) liver, not elsewhere classified: Secondary | ICD-10-CM | POA: Diagnosis not present

## 2023-07-18 DIAGNOSIS — Z888 Allergy status to other drugs, medicaments and biological substances status: Secondary | ICD-10-CM | POA: Diagnosis not present

## 2023-07-18 DIAGNOSIS — Z79899 Other long term (current) drug therapy: Secondary | ICD-10-CM | POA: Diagnosis not present

## 2023-07-18 DIAGNOSIS — Z09 Encounter for follow-up examination after completed treatment for conditions other than malignant neoplasm: Secondary | ICD-10-CM | POA: Diagnosis not present

## 2023-07-18 DIAGNOSIS — R945 Abnormal results of liver function studies: Secondary | ICD-10-CM | POA: Diagnosis not present

## 2023-07-18 DIAGNOSIS — I10 Essential (primary) hypertension: Secondary | ICD-10-CM | POA: Diagnosis not present

## 2023-07-18 DIAGNOSIS — R935 Abnormal findings on diagnostic imaging of other abdominal regions, including retroperitoneum: Secondary | ICD-10-CM | POA: Diagnosis not present

## 2023-07-18 DIAGNOSIS — Z87891 Personal history of nicotine dependence: Secondary | ICD-10-CM | POA: Diagnosis not present

## 2023-07-18 DIAGNOSIS — Z1211 Encounter for screening for malignant neoplasm of colon: Secondary | ICD-10-CM | POA: Diagnosis not present

## 2023-07-18 DIAGNOSIS — F1090 Alcohol use, unspecified, uncomplicated: Secondary | ICD-10-CM | POA: Diagnosis not present

## 2023-07-18 DIAGNOSIS — Z860100 Personal history of colon polyps, unspecified: Secondary | ICD-10-CM | POA: Diagnosis not present

## 2023-09-06 DIAGNOSIS — H353231 Exudative age-related macular degeneration, bilateral, with active choroidal neovascularization: Secondary | ICD-10-CM | POA: Diagnosis not present

## 2023-12-06 DIAGNOSIS — H353232 Exudative age-related macular degeneration, bilateral, with inactive choroidal neovascularization: Secondary | ICD-10-CM | POA: Diagnosis not present

## 2023-12-06 DIAGNOSIS — H43813 Vitreous degeneration, bilateral: Secondary | ICD-10-CM | POA: Diagnosis not present

## 2023-12-06 DIAGNOSIS — H353133 Nonexudative age-related macular degeneration, bilateral, advanced atrophic without subfoveal involvement: Secondary | ICD-10-CM | POA: Diagnosis not present

## 2023-12-25 DIAGNOSIS — H353222 Exudative age-related macular degeneration, left eye, with inactive choroidal neovascularization: Secondary | ICD-10-CM | POA: Diagnosis not present

## 2024-01-01 DIAGNOSIS — E119 Type 2 diabetes mellitus without complications: Secondary | ICD-10-CM | POA: Diagnosis not present

## 2024-01-01 DIAGNOSIS — Z125 Encounter for screening for malignant neoplasm of prostate: Secondary | ICD-10-CM | POA: Diagnosis not present

## 2024-01-01 DIAGNOSIS — E7800 Pure hypercholesterolemia, unspecified: Secondary | ICD-10-CM | POA: Diagnosis not present

## 2024-01-01 DIAGNOSIS — Z79899 Other long term (current) drug therapy: Secondary | ICD-10-CM | POA: Diagnosis not present

## 2024-01-09 DIAGNOSIS — F419 Anxiety disorder, unspecified: Secondary | ICD-10-CM | POA: Diagnosis not present

## 2024-01-09 DIAGNOSIS — Z6826 Body mass index (BMI) 26.0-26.9, adult: Secondary | ICD-10-CM | POA: Diagnosis not present

## 2024-01-09 DIAGNOSIS — E7800 Pure hypercholesterolemia, unspecified: Secondary | ICD-10-CM | POA: Diagnosis not present

## 2024-01-09 DIAGNOSIS — I10 Essential (primary) hypertension: Secondary | ICD-10-CM | POA: Diagnosis not present

## 2024-01-24 DIAGNOSIS — H353222 Exudative age-related macular degeneration, left eye, with inactive choroidal neovascularization: Secondary | ICD-10-CM | POA: Diagnosis not present

## 2024-01-24 DIAGNOSIS — H43813 Vitreous degeneration, bilateral: Secondary | ICD-10-CM | POA: Diagnosis not present

## 2024-01-24 DIAGNOSIS — H353133 Nonexudative age-related macular degeneration, bilateral, advanced atrophic without subfoveal involvement: Secondary | ICD-10-CM | POA: Diagnosis not present

## 2024-02-04 DIAGNOSIS — Z79899 Other long term (current) drug therapy: Secondary | ICD-10-CM | POA: Diagnosis not present

## 2024-02-04 DIAGNOSIS — I10 Essential (primary) hypertension: Secondary | ICD-10-CM | POA: Diagnosis not present

## 2024-02-04 DIAGNOSIS — E785 Hyperlipidemia, unspecified: Secondary | ICD-10-CM | POA: Diagnosis not present

## 2024-02-04 DIAGNOSIS — F419 Anxiety disorder, unspecified: Secondary | ICD-10-CM | POA: Diagnosis not present

## 2024-02-04 DIAGNOSIS — H3530 Unspecified macular degeneration: Secondary | ICD-10-CM | POA: Diagnosis not present

## 2024-02-04 DIAGNOSIS — K113 Abscess of salivary gland: Secondary | ICD-10-CM | POA: Diagnosis not present

## 2024-02-04 DIAGNOSIS — Z888 Allergy status to other drugs, medicaments and biological substances status: Secondary | ICD-10-CM | POA: Diagnosis not present

## 2024-02-04 DIAGNOSIS — K029 Dental caries, unspecified: Secondary | ICD-10-CM | POA: Diagnosis not present

## 2024-02-04 DIAGNOSIS — M1990 Unspecified osteoarthritis, unspecified site: Secondary | ICD-10-CM | POA: Diagnosis not present

## 2024-02-04 DIAGNOSIS — R221 Localized swelling, mass and lump, neck: Secondary | ICD-10-CM | POA: Diagnosis not present

## 2024-02-12 DIAGNOSIS — E876 Hypokalemia: Secondary | ICD-10-CM | POA: Diagnosis not present

## 2024-02-14 DIAGNOSIS — L049 Acute lymphadenitis, unspecified: Secondary | ICD-10-CM | POA: Diagnosis not present

## 2024-02-14 DIAGNOSIS — J342 Deviated nasal septum: Secondary | ICD-10-CM | POA: Diagnosis not present

## 2024-02-14 DIAGNOSIS — K1121 Acute sialoadenitis: Secondary | ICD-10-CM | POA: Diagnosis not present

## 2024-02-21 DIAGNOSIS — H43813 Vitreous degeneration, bilateral: Secondary | ICD-10-CM | POA: Diagnosis not present

## 2024-02-21 DIAGNOSIS — H353133 Nonexudative age-related macular degeneration, bilateral, advanced atrophic without subfoveal involvement: Secondary | ICD-10-CM | POA: Diagnosis not present

## 2024-02-21 DIAGNOSIS — H353222 Exudative age-related macular degeneration, left eye, with inactive choroidal neovascularization: Secondary | ICD-10-CM | POA: Diagnosis not present

## 2024-02-27 DIAGNOSIS — H26493 Other secondary cataract, bilateral: Secondary | ICD-10-CM | POA: Diagnosis not present

## 2024-02-27 DIAGNOSIS — H35722 Serous detachment of retinal pigment epithelium, left eye: Secondary | ICD-10-CM | POA: Diagnosis not present

## 2024-02-27 DIAGNOSIS — Z961 Presence of intraocular lens: Secondary | ICD-10-CM | POA: Diagnosis not present

## 2024-02-27 DIAGNOSIS — H353134 Nonexudative age-related macular degeneration, bilateral, advanced atrophic with subfoveal involvement: Secondary | ICD-10-CM | POA: Diagnosis not present

## 2024-02-27 DIAGNOSIS — H26491 Other secondary cataract, right eye: Secondary | ICD-10-CM | POA: Diagnosis not present

## 2024-02-27 DIAGNOSIS — H18413 Arcus senilis, bilateral: Secondary | ICD-10-CM | POA: Diagnosis not present

## 2024-03-19 DIAGNOSIS — H26492 Other secondary cataract, left eye: Secondary | ICD-10-CM | POA: Diagnosis not present

## 2024-03-20 DIAGNOSIS — H353133 Nonexudative age-related macular degeneration, bilateral, advanced atrophic without subfoveal involvement: Secondary | ICD-10-CM | POA: Diagnosis not present

## 2024-03-20 DIAGNOSIS — H353221 Exudative age-related macular degeneration, left eye, with active choroidal neovascularization: Secondary | ICD-10-CM | POA: Diagnosis not present

## 2024-03-20 DIAGNOSIS — H43813 Vitreous degeneration, bilateral: Secondary | ICD-10-CM | POA: Diagnosis not present

## 2024-03-20 DIAGNOSIS — H353212 Exudative age-related macular degeneration, right eye, with inactive choroidal neovascularization: Secondary | ICD-10-CM | POA: Diagnosis not present

## 2024-04-25 DIAGNOSIS — H43813 Vitreous degeneration, bilateral: Secondary | ICD-10-CM | POA: Diagnosis not present

## 2024-04-25 DIAGNOSIS — H353221 Exudative age-related macular degeneration, left eye, with active choroidal neovascularization: Secondary | ICD-10-CM | POA: Diagnosis not present

## 2024-04-25 DIAGNOSIS — H353133 Nonexudative age-related macular degeneration, bilateral, advanced atrophic without subfoveal involvement: Secondary | ICD-10-CM | POA: Diagnosis not present

## 2024-04-25 DIAGNOSIS — H353212 Exudative age-related macular degeneration, right eye, with inactive choroidal neovascularization: Secondary | ICD-10-CM | POA: Diagnosis not present

## 2024-05-24 DIAGNOSIS — H43813 Vitreous degeneration, bilateral: Secondary | ICD-10-CM | POA: Diagnosis not present

## 2024-05-24 DIAGNOSIS — H353212 Exudative age-related macular degeneration, right eye, with inactive choroidal neovascularization: Secondary | ICD-10-CM | POA: Diagnosis not present

## 2024-05-24 DIAGNOSIS — H353133 Nonexudative age-related macular degeneration, bilateral, advanced atrophic without subfoveal involvement: Secondary | ICD-10-CM | POA: Diagnosis not present

## 2024-05-24 DIAGNOSIS — H353221 Exudative age-related macular degeneration, left eye, with active choroidal neovascularization: Secondary | ICD-10-CM | POA: Diagnosis not present

## 2024-06-26 DIAGNOSIS — H353133 Nonexudative age-related macular degeneration, bilateral, advanced atrophic without subfoveal involvement: Secondary | ICD-10-CM | POA: Diagnosis not present

## 2024-06-26 DIAGNOSIS — H353212 Exudative age-related macular degeneration, right eye, with inactive choroidal neovascularization: Secondary | ICD-10-CM | POA: Diagnosis not present

## 2024-06-26 DIAGNOSIS — H353221 Exudative age-related macular degeneration, left eye, with active choroidal neovascularization: Secondary | ICD-10-CM | POA: Diagnosis not present

## 2024-06-26 DIAGNOSIS — H43813 Vitreous degeneration, bilateral: Secondary | ICD-10-CM | POA: Diagnosis not present

## 2024-06-27 DIAGNOSIS — E785 Hyperlipidemia, unspecified: Secondary | ICD-10-CM | POA: Diagnosis not present

## 2024-06-27 DIAGNOSIS — Z79899 Other long term (current) drug therapy: Secondary | ICD-10-CM | POA: Diagnosis not present

## 2024-06-28 DIAGNOSIS — D225 Melanocytic nevi of trunk: Secondary | ICD-10-CM | POA: Diagnosis not present

## 2024-06-28 DIAGNOSIS — L570 Actinic keratosis: Secondary | ICD-10-CM | POA: Diagnosis not present

## 2024-06-28 DIAGNOSIS — L820 Inflamed seborrheic keratosis: Secondary | ICD-10-CM | POA: Diagnosis not present

## 2024-06-28 DIAGNOSIS — L814 Other melanin hyperpigmentation: Secondary | ICD-10-CM | POA: Diagnosis not present

## 2024-06-28 DIAGNOSIS — L578 Other skin changes due to chronic exposure to nonionizing radiation: Secondary | ICD-10-CM | POA: Diagnosis not present

## 2024-07-11 DIAGNOSIS — I10 Essential (primary) hypertension: Secondary | ICD-10-CM | POA: Diagnosis not present

## 2024-07-11 DIAGNOSIS — Z0000 Encounter for general adult medical examination without abnormal findings: Secondary | ICD-10-CM | POA: Diagnosis not present

## 2024-07-11 DIAGNOSIS — Z1331 Encounter for screening for depression: Secondary | ICD-10-CM | POA: Diagnosis not present

## 2024-07-11 DIAGNOSIS — R350 Frequency of micturition: Secondary | ICD-10-CM | POA: Diagnosis not present

## 2024-07-11 DIAGNOSIS — Z6826 Body mass index (BMI) 26.0-26.9, adult: Secondary | ICD-10-CM | POA: Diagnosis not present

## 2024-08-05 DIAGNOSIS — H353221 Exudative age-related macular degeneration, left eye, with active choroidal neovascularization: Secondary | ICD-10-CM | POA: Diagnosis not present

## 2024-09-03 NOTE — Progress Notes (Signed)
 Bryan Ellis                                          MRN: [id number]   09/03/2024   The [facility] Quality Team Specialist reviewed this patient medical record for the purposes of chart review for care gap closure. The following were reviewed: chart review for care gap closure-kidney health evaluation for diabetes:eGFR  and uACR.    [facility] Quality Team
